# Patient Record
Sex: Female | Born: 1970
Health system: Southern US, Community
[De-identification: ages and names within clinical notes are randomized; demographics above are authoritative.]

## PROBLEM LIST (undated history)

## (undated) DIAGNOSIS — R51 Headache: Secondary | ICD-10-CM

## (undated) DIAGNOSIS — M549 Dorsalgia, unspecified: Secondary | ICD-10-CM

## (undated) DIAGNOSIS — E785 Hyperlipidemia, unspecified: Secondary | ICD-10-CM

## (undated) DIAGNOSIS — R351 Nocturia: Secondary | ICD-10-CM

## (undated) DIAGNOSIS — I671 Cerebral aneurysm, nonruptured: Secondary | ICD-10-CM

## (undated) DIAGNOSIS — I10 Essential (primary) hypertension: Secondary | ICD-10-CM

## (undated) HISTORY — PX: TONSILLECTOMY: SUR1361

## (undated) HISTORY — PX: TUBAL LIGATION: SHX77

---

## 2007-10-06 ENCOUNTER — Emergency Department (HOSPITAL_COMMUNITY): Admission: EM | Admit: 2007-10-06 | Discharge: 2007-10-06 | Payer: Self-pay | Admitting: Emergency Medicine

## 2008-03-30 ENCOUNTER — Other Ambulatory Visit: Admission: RE | Admit: 2008-03-30 | Discharge: 2008-03-30 | Payer: Self-pay | Admitting: Obstetrics and Gynecology

## 2008-04-08 ENCOUNTER — Inpatient Hospital Stay (HOSPITAL_COMMUNITY): Admission: AD | Admit: 2008-04-08 | Discharge: 2008-04-10 | Payer: Self-pay | Admitting: Obstetrics & Gynecology

## 2008-04-08 ENCOUNTER — Ambulatory Visit: Payer: Self-pay | Admitting: Family Medicine

## 2008-05-05 ENCOUNTER — Ambulatory Visit: Payer: Self-pay | Admitting: Family

## 2008-05-05 ENCOUNTER — Inpatient Hospital Stay (HOSPITAL_COMMUNITY): Admission: AD | Admit: 2008-05-05 | Discharge: 2008-05-09 | Payer: Self-pay | Admitting: Obstetrics & Gynecology

## 2009-12-03 ENCOUNTER — Emergency Department (HOSPITAL_COMMUNITY): Admission: EM | Admit: 2009-12-03 | Discharge: 2009-12-03 | Payer: Self-pay | Admitting: Emergency Medicine

## 2010-02-13 ENCOUNTER — Emergency Department (HOSPITAL_COMMUNITY)
Admission: EM | Admit: 2010-02-13 | Discharge: 2010-02-13 | Payer: Self-pay | Source: Home / Self Care | Admitting: Emergency Medicine

## 2010-03-05 ENCOUNTER — Emergency Department (HOSPITAL_COMMUNITY)
Admission: EM | Admit: 2010-03-05 | Discharge: 2010-03-05 | Payer: Self-pay | Source: Home / Self Care | Admitting: Emergency Medicine

## 2010-03-05 ENCOUNTER — Emergency Department (HOSPITAL_COMMUNITY): Admission: EM | Admit: 2010-03-05 | Payer: Self-pay | Source: Home / Self Care | Admitting: Emergency Medicine

## 2010-05-07 ENCOUNTER — Other Ambulatory Visit: Payer: Self-pay | Admitting: Obstetrics & Gynecology

## 2010-05-07 ENCOUNTER — Other Ambulatory Visit (HOSPITAL_COMMUNITY)
Admission: RE | Admit: 2010-05-07 | Discharge: 2010-05-07 | Disposition: A | Discharge status: Home / Self Care | Payer: Medicaid Other | Source: Ambulatory Visit | Attending: Obstetrics & Gynecology | Admitting: Obstetrics & Gynecology

## 2010-05-07 DIAGNOSIS — Z01419 Encounter for gynecological examination (general) (routine) without abnormal findings: Secondary | ICD-10-CM | POA: Insufficient documentation

## 2010-05-07 DIAGNOSIS — Z113 Encounter for screening for infections with a predominantly sexual mode of transmission: Secondary | ICD-10-CM | POA: Insufficient documentation

## 2010-06-16 ENCOUNTER — Emergency Department (HOSPITAL_COMMUNITY)
Admission: EM | Admit: 2010-06-16 | Discharge: 2010-06-16 | Disposition: A | Discharge status: Nursing Home (Includes ICF) | Payer: Medicaid Other | Attending: Emergency Medicine | Admitting: Emergency Medicine

## 2010-06-16 ENCOUNTER — Emergency Department (HOSPITAL_COMMUNITY): Payer: Medicaid Other

## 2010-06-16 DIAGNOSIS — I10 Essential (primary) hypertension: Secondary | ICD-10-CM | POA: Insufficient documentation

## 2010-06-16 DIAGNOSIS — I609 Nontraumatic subarachnoid hemorrhage, unspecified: Secondary | ICD-10-CM | POA: Insufficient documentation

## 2010-06-16 DIAGNOSIS — R51 Headache: Secondary | ICD-10-CM | POA: Insufficient documentation

## 2010-06-16 DIAGNOSIS — O99891 Other specified diseases and conditions complicating pregnancy: Secondary | ICD-10-CM | POA: Insufficient documentation

## 2010-06-16 DIAGNOSIS — R42 Dizziness and giddiness: Secondary | ICD-10-CM | POA: Insufficient documentation

## 2010-06-16 DIAGNOSIS — J329 Chronic sinusitis, unspecified: Secondary | ICD-10-CM | POA: Insufficient documentation

## 2010-06-16 HISTORY — PX: INTRACRANIAL ANEURYSM REPAIR: SHX1839

## 2010-06-16 LAB — DIFFERENTIAL
Eosinophils Absolute: 0 10*3/uL (ref 0.0–0.7)
Eosinophils Relative: 0 % (ref 0–5)
Monocytes Absolute: 0.3 10*3/uL (ref 0.1–1.0)
Neutro Abs: 12.9 10*3/uL — ABNORMAL HIGH (ref 1.7–7.7)

## 2010-06-16 LAB — CBC
Hemoglobin: 13.5 g/dL (ref 12.0–15.0)
MCH: 28.4 pg (ref 26.0–34.0)
MCV: 84.5 fL (ref 78.0–100.0)
RBC: 4.76 MIL/uL (ref 3.87–5.11)
WBC: 14.1 10*3/uL — ABNORMAL HIGH (ref 4.0–10.5)

## 2010-06-16 LAB — GLUCOSE, CAPILLARY

## 2010-06-16 LAB — BASIC METABOLIC PANEL
BUN: 5 mg/dL — ABNORMAL LOW (ref 6–23)
Chloride: 101 mEq/L (ref 96–112)
Creatinine, Ser: 0.49 mg/dL (ref 0.4–1.2)
Glucose, Bld: 119 mg/dL — ABNORMAL HIGH (ref 70–99)

## 2010-06-25 LAB — COMPREHENSIVE METABOLIC PANEL
ALT: 12 U/L (ref 0–35)
Alkaline Phosphatase: 115 U/L (ref 39–117)
CO2: 19 mEq/L (ref 19–32)
Calcium: 8.9 mg/dL (ref 8.4–10.5)
GFR calc Af Amer: >60 mL/min (ref >60)
Glucose, Bld: 84 mg/dL (ref 70–99)
Sodium: 138 mEq/L (ref 135–145)
Total Bilirubin: 0.5 mg/dL (ref 0.3–1.2)

## 2010-06-25 LAB — CBC
HCT: 35.6 % — ABNORMAL LOW (ref 36.0–46.0)
Hemoglobin: 11.9 g/dL — ABNORMAL LOW (ref 12.0–15.0)
Hemoglobin: 13 g/dL (ref 12.0–15.0)
MCHC: 33.4 g/dL (ref 30.0–36.0)
MCV: 88.8 fL (ref 78.0–100.0)
Platelets: 102 10*3/uL — ABNORMAL LOW (ref 150–400)
Platelets: 116 10*3/uL — ABNORMAL LOW (ref 150–400)
WBC: 9.2 10*3/uL (ref 4.0–10.5)

## 2010-06-25 LAB — CREATININE CLEARANCE, URINE, 24 HOUR
Creatinine Clearance: 139 mL/min — ABNORMAL HIGH (ref 75–115)
Creatinine, 24H Ur: 1039 mg/d (ref 700–1800)
Creatinine: 0.52 mg/dL (ref 0.40–1.20)
Urine Total Volume-CRCL: 550 mL

## 2010-06-25 LAB — BASIC METABOLIC PANEL
Calcium: 8.8 mg/dL (ref 8.4–10.5)
Chloride: 109 mEq/L (ref 96–112)
Creatinine, Ser: 0.52 mg/dL (ref 0.4–1.2)
GFR calc Af Amer: >60 mL/min (ref >60)
Glucose, Bld: 86 mg/dL (ref 70–99)
Potassium: 3.8 mEq/L (ref 3.5–5.1)

## 2010-06-25 LAB — PROTEIN, URINE, 24 HOUR: Protein, Urine: 9 mg/dL

## 2010-06-26 LAB — LACTATE DEHYDROGENASE: LDH: 155 U/L (ref 94–250)

## 2010-06-26 LAB — CBC
HCT: 32.6 % — ABNORMAL LOW (ref 36.0–46.0)
Hemoglobin: 12.6 g/dL (ref 12.0–15.0)
Hemoglobin: 13.6 g/dL (ref 12.0–15.0)
MCHC: 33.7 g/dL (ref 30.0–36.0)
MCHC: 33.8 g/dL (ref 30.0–36.0)
MCV: 89.2 fL (ref 78.0–100.0)
MCV: 89.3 fL (ref 78.0–100.0)
Platelets: 120 10*3/uL — ABNORMAL LOW (ref 150–400)
RBC: 3.65 MIL/uL — ABNORMAL LOW (ref 3.87–5.11)
RBC: 4.18 MIL/uL (ref 3.87–5.11)
WBC: 7.8 10*3/uL (ref 4.0–10.5)

## 2010-06-26 LAB — COMPREHENSIVE METABOLIC PANEL
ALT: 11 U/L (ref 0–35)
CO2: 21 mEq/L (ref 19–32)
Calcium: 9.3 mg/dL (ref 8.4–10.5)
Chloride: 108 mEq/L (ref 96–112)
Creatinine, Ser: 0.66 mg/dL (ref 0.4–1.2)
GFR calc Af Amer: >60 mL/min (ref >60)
GFR calc non Af Amer: >60 mL/min (ref >60)
Sodium: 138 mEq/L (ref 135–145)

## 2010-06-26 LAB — URIC ACID: Uric Acid, Serum: 7.6 mg/dL — ABNORMAL HIGH (ref 2.4–7.0)

## 2010-06-26 LAB — RPR: RPR Ser Ql: NONREACTIVE

## 2010-07-24 NOTE — Discharge Summary (Signed)
[name], [name]              ACCOUNT NO.:  [id number]   MEDICAL RECORD NO.:  [id number]          PATIENT TYPE:  INP   LOCATION:  9155                          FACILITY:  [facility]   PHYSICIAN:  Tanya S. Shawnie Pons, M.D.   DATE OF BIRTH:  1970/10/17   DATE OF ADMISSION:  04/08/2008  DATE OF DISCHARGE:  04/10/2008                               DISCHARGE SUMMARY   FINAL DIAGNOSES:  1. Intrauterine pregnancy at 37-3/7th weeks.  2. Breech presentation.  3. Chronic hypertension versus preeclampsia.  Preeclampsia ruled out      by 24-hour urine protein.  4. History of tobacco use.  5. Asthma.   PERTINENT LABS:  A 24-hour urine as 50 mg of protein.  Low platelet  count that was stable in the 115 range, creatinine of 0.5.  LFTs were  within normal limits.  Hemoglobin of 13.  Blood pressure during this  hospitalization were between the 110s-140s/60s-80s.   REASON FOR ADMISSION:  Briefly, please see H and P on the chart.  The  patient is a 40 year old G4, P3, at 71 and 3/7, on admission had  elevated blood pressures and was seen at the [facility].  She was found  to have low platelet count of 114 there and was sent in for 24-hour  urine and ultrasound.  The patient was admitted to the floor, where 24-  hour urine was started.  She had serial blood pressure monitoring and  labs, which showed platelet count was stable at 116.  She underwent  ultrasonography, which showed a baby at 51st percentile, 5 pounds 6  ounces, AFI of 11.87 in footling breech presentation.  She denies  significant headache or right upper quadrant pain.  Given the patient's  preterm condition and negative 24-hour urine, I felt the patient was  stable and lack of symptoms, so it was felt the patient was stable for  discharge.   DISCHARGE DISPOSITION AND CONDITION:  The patient was discharged home in  improved condition.  She will be discharged home on bedrest.  She will  follow up with the [facility] on April 11, 2008.   The goal is to get  her to at least 37 plus weeks. At that time, she may undergo external  cephalic version, if the baby remains in the breech presentation.  This  is usually done between 36th and 37th weeks.  The patient is also given  PIH precautions.  Continue Aldomet as previously scheduled.      Shelbie Proctor. Shawnie Pons, M.D.  Electronically Signed     TSP/MEDQ  D:  04/10/2008  T:  04/10/2008  Job:  [id number]

## 2010-07-24 NOTE — Op Note (Signed)
NAMEBILLIJO, DILLING              ACCOUNT NO.:  [id number]   MEDICAL RECORD NO.:  [id number]          PATIENT TYPE:  INP   LOCATION:  9133                          [facility]:  [facility]   PHYSICIAN:  Allie Bossier, MD        DATE OF BIRTH:  05-25-1970   DATE OF PROCEDURE:  05/08/2008  DATE OF DISCHARGE:  05/09/2008                               OPERATIVE REPORT   PREOPERATIVE DIAGNOSIS:  Multiparity, desires sterility.   POSTOPERATIVE DIAGNOSIS:  Multiparity, desires sterility.   PROCEDURE:  Application of Filshie clips, bilateral tubal sterilization  procedure.   SURGEON:  Myra C. Marice Potter, MD   ANESTHESIA:  Epidural, Cristela Blue, MD   COMPLICATIONS:  None.   ESTIMATED BLOOD LOSS:  Minimal.   SPECIMENS:  None.   FINDINGS:  Normal oviducts.   DETAILS OF PROCEDURE AND FINDINGS:  Risks, benefits, alternatives, and  failure rate (1/200) were explained, understood, and accepted.  Consents  were signed.  She declines alternative forms of birth control.  She was  taken to the operating room and her epidural was bolused for surgery.  Her abdomen was prepped and draped in usual sterile fashion.  Adequate  anesthesia was assured and her umbilicus was inspected.  To provide an  excellent cosmetic result, I opted to go in a transverse fashion through  the umbilicus, elevated the fascia with Kocher clamps, and incised it  with curved Mayo scissors.  The fascial incision was extended  bilaterally.  She was placed in Trendelenburg position and the omentum  was packed with a Ray-Tec.  Army-Navy retractors were used to visualize  the oviduct on her right.  It was grasped with Babcock clamp and traced  to its fimbriated end.  It was then retraced to the isthmic region where  a Hulka clip was placed across the entire oviduct in a perpendicular  fashion.  No bleeding was noted.  I repeated the same procedure on the  left side.  Again, the fimbriae were identified and the Filshie clip was  placed  perpendicular to the tube in the isthmic region.  The Ray-Tec was  removed.  She was taken out of Trendelenburg position and the fascia was  elevated with Kocher clamps.  Fascia was then closed with a 0 Vicryl  running nonlocking suture.  The subcutaneous tissue and her umbilicus  was infiltrated with 10 mL of 0.5% Marcaine.  A subcuticular closure was  done with 4-0 Vicryl suture and Dermabond was placed on the incision.  She was taken to recovery room in stable condition, Instrument, sponge,  and needle counts were correct.      Allie Bossier, MD  Electronically Signed     MCD/MEDQ  D:  05/10/2008  T:  05/11/2008  Job:  [id number]

## 2010-07-27 NOTE — Discharge Summary (Signed)
[name], [name]              ACCOUNT NO.:  [id number]   MEDICAL RECORD NO.:  [id number]          PATIENT TYPE:  INP   LOCATION:  9133                          [facility]ILITY[facility]  [facility]   PHYSICIAN:  Scheryl Darter, MD       DATE OF BIRTH:  [birth year]   DATE OF ADMISSION:  05/05/2008  DATE OF DISCHARGE:  05/09/2008                               DISCHARGE SUMMARY   DISCHARGE DIAGNOSES:  1. Chronic hypertension.  2. Term pregnancy delivered by spontaneous vaginal delivery.  3. Status post bilateral tubal sterilization with Filshie clips.  4. Advanced maternal age.   DISCHARGE MEDICATIONS:  1. Percocet 5/325 mg p.o. q.4 h. p.r.n. pain.  2. Tenoretic 50/25 1 tablet p.o. daily.  3. Colace 100 mg p.o. b.i.d. p.r.n. constipation.  4. Ibuprofen 600 mg p.o. q.4 h. p.r.n. pain.  5. Prenatal vitamin daily.   DISCONTINUED MEDICATIONS:  Aldomet.   CONSULTANTS:  None.   BRIEF HOSPITAL COURSE:  This is a 40 year old G4, P3-0-3 who presented  at 39 weeks 0 days for induction of labor with chronic hypertension with  severely elevated blood pressures.  On day 1 of admission, the patient  denied signs and symptoms of preeclampsia.  The patient had a CMET which  was within normal limits, platelets of 120, hemoglobin of 13.6,  hematocrit of 40.2, uric acid 7.6, LDH of 155 on admission.  The patient  was started on Pitocin for induction, also had Foley bulb placed.  The  patient was started on Aldomet for chronic hypertension with labetalol  p.r.n.  The patient's admission blood pressure was 158/88.  On day 2 of  admission, the patient's blood pressure increased to 173/105; however,  the patient continued to deny symptoms of preeclampsia.  The patient was  continued on Pitocin started on labetalol IV as well as magnesium given  her severely elevated blood pressures.  The patient's blood pressures  improved with IV labetalol on day 2 of hospital admission and the  patient has had artificial rupture of  membranes and Cytotec placed  intravaginally as a part of induction process.  Fetal heart tracing  remained overall reassuring during labor course.  The patient delivered  a viable female infant on May 07, 2008, with Apgars of 9 and 9 by  NSVD under epidural anesthesia.  Nuchal cord was reduced x2, cord was  clamped and cut, and infant to mother then RN.  Placenta delivered  spontaneously via Tomasa Blase, no lacerations requiring repair were noted,  EBL was 700 mL.  The patient and infant were stable.  On day of  discharge, the patient had blood pressures of 155-158 over 92-95.  The  patient was started on Tenoretic 50/25 mg p.o. daily for chronic  hypertension.  The patient also had received bilateral tubal  sterilization with Filshie clips on May 08, 2008.  Please refer to  operative notes for procedure details.  The patient was discharged to  home on May 09, 2008, in stable condition.  Infant was discharged  to home with mother.  The patient will follow up in 6 weeks at [facility].  The patient is bottle feeding with bilateral tubal sterilization  for contraception.  The patient was afebrile with pain well controlled,  ambulating with no complaints on day of discharge.      Bobby Rumpf, MD      Scheryl Darter, MD  Electronically Signed    KC/MEDQ  D:  06/08/2008  T:  06/08/2008  Job:  [id number]

## 2010-09-14 ENCOUNTER — Other Ambulatory Visit: Payer: Self-pay | Admitting: Obstetrics & Gynecology

## 2010-09-21 ENCOUNTER — Encounter (HOSPITAL_COMMUNITY)
Admission: RE | Admit: 2010-09-21 | Discharge: 2010-09-21 | Disposition: A | Discharge status: Home / Self Care | Payer: Medicaid Other | Source: Ambulatory Visit | Attending: Obstetrics & Gynecology | Admitting: Obstetrics & Gynecology

## 2010-09-21 ENCOUNTER — Encounter (HOSPITAL_COMMUNITY): Payer: Self-pay

## 2010-09-21 ENCOUNTER — Other Ambulatory Visit: Payer: Self-pay

## 2010-09-21 HISTORY — DX: Essential (primary) hypertension: I10

## 2010-09-21 HISTORY — DX: Headache: R51

## 2010-09-21 LAB — CBC
HCT: 37.7 % (ref 36.0–46.0)
MCH: 26.1 pg (ref 26.0–34.0)
MCHC: 32.4 g/dL (ref 30.0–36.0)
MCV: 80.6 fL (ref 78.0–100.0)
RDW: 14.7 % (ref 11.5–15.5)
WBC: 7.2 10*3/uL (ref 4.0–10.5)

## 2010-09-21 LAB — COMPREHENSIVE METABOLIC PANEL
Albumin: 3.6 g/dL (ref 3.5–5.2)
BUN: 7 mg/dL (ref 6–23)
Calcium: 10 mg/dL (ref 8.4–10.5)
Chloride: 104 mEq/L (ref 96–112)
Creatinine, Ser: 0.7 mg/dL (ref 0.50–1.10)
GFR calc non Af Amer: >60 mL/min (ref >60)
Total Bilirubin: 0.1 mg/dL — ABNORMAL LOW (ref 0.3–1.2)

## 2010-09-21 LAB — URINALYSIS, ROUTINE W REFLEX MICROSCOPIC
Ketones, ur: 15 mg/dL — AB
Leukocytes, UA: NEGATIVE
Nitrite: NEGATIVE
Protein, ur: NEGATIVE mg/dL
pH: 6 (ref 5.0–8.0)

## 2010-09-21 LAB — HCG, QUANTITATIVE, PREGNANCY: hCG, Beta Chain, Quant, S: 1 m[IU]/mL (ref <5)

## 2010-09-21 NOTE — Patient Instructions (Signed)
20 [name]  09/21/2010   Your procedure is scheduled on:  09/26/10  Report to [facility] at 10:00 AM.  Call this number if you have problems the morning of surgery: [phone]   Remember:   Do not eat food:After Midnight.  Do not drink clear liquids: After Midnight.  Take these medicines the morning of surgery with A SIP OF WATER: Bring Albuterol inhaler and take aldomet.   Do not wear jewelry, make-up or nail polish.  Do not bring valuables to the hospital.  Contacts, dentures or bridgework may not be worn into surgery.  Leave suitcase in the car. After surgery it may be brought to your room.  For patients admitted to the hospital, checkout time is 11:00 AM the day of discharge.   Patients discharged the day of surgery will not be allowed to drive home.  Name and phone number of your driver: mom  Special Instructions: CHG Shower Shower 2 days before surgery and 1 day before surgery with Hibiclens.   Please read over the following fact sheets that you were given: Pain Booklet, MRSA Information, Surgical Site Infection Prevention and Anesthesia Post-op Instructions    General Instructions for Surgery These instructions are generic. They cover multiple surgeries and are a general pre-surgical guideline. They do not apply to all procedures. You may have questions. Answers will be provided by your caregiver. PREPARING FOR SURGERY  Stop smoking at least two weeks prior to surgery. This lowers risk during surgery. Ask your caregiver for help with this if needed. The benefits are well worth it. This is a good time for a healthy lifestyle change.   Your caregiver may advise that you stop taking certain medications that may affect the outcome of the surgery and your ability to heal. For example, you may need to stop taking anti-inflammatories, such as aspirin or ibuprofen because of possible bleeding problems. Other medications may have interactions with anesthesia.   BE SURE TO LET  YOUR CAREGIVER KNOW IF YOU HAVE BEEN ON STEROIDS FOR LONG PERIODS OF TIME. THIS IS CRITICAL.   Your caregiver will discuss possible risks and complications with you before surgery. In addition to the usual risks of anesthesia, other common risks and complications include blood loss and replacement (not applicable to minor surgical procedures), temporary increase in pain due to surgery, uncorrected pain or problems the surgery was meant to correct, infection, or new damage.  BEFORE SURGERY Check in at the admissions desk to fill out necessary forms if you are not pre-registered. There will be consent forms to sign prior to the procedure. There is a waiting area for your family while you are having your procedure.  LET YOUR CAREGIVERS KNOW ABOUT THE FOLLOWING:  Allergies.  Medications taken including herbs, eye drops, over the counter medications, and creams.   Use of steroids (by mouth or creams).   Previous problems with anesthetics or novocaine.   Possibility of pregnancy, if this applies.  History of blood clots (thrombophlebitis).   History of bleeding or blood problems.   Previous surgery.   Other health problems.   FOLLOWING SURGERY You will be taken to the recovery area where a nurse will watch and check your progress. Once you are awake, stable, and taking fluids well, barring other problems you will be allowed to go home. Once home, an ice pack wrapped in a light towel applied to your operative site may help with discomfort and keep the swelling down. Follow instructions as suggested by your  caregiver. HOME CARE INSTRUCTIONS  Follow your caregiver's instructions as to activities, exercises, physical therapy, or driving a car.   Weight reduction may be helpful depending upon the type of surgery.   Daily exercise is helpful. Maintain strength and range of motion as instructed.   Only take over-the-counter or prescription medicines for pain, discomfort, or fever as directed by  your caregiver.  SEEK MEDICAL CARE IF:  You notice increased bleeding (more than a small spot) from the wound.   You soak more than four pads following a uterine procedure unless instructed otherwise.   There is redness, swelling, or increasing pain in the wound.   You notice pus coming from wound.   An unexplained oral temperature over 101 develops, or as your caregiver suggests.   You notice a foul smell coming from the wound or dressing.   You become light headed or if you pass out (faint).  SEEK IMMEDIATE MEDICAL CARE IF:  You develop a rash.   You have difficulty breathing.   You develop any allergic problems.  Document Released: 06/03/2000 Document Re-Released: 05/24/2008 [facility] Patient Information 2011 [place], [place].

## 2010-09-25 NOTE — H&P (Signed)
Preoperative History and Physical  [name] is a 40 y.o. status post BTL in past, who had a failure, conceived and delivered.  She wants a definitive surgical sterilization.  She is admitted for bilateral salpingectomy.    Of note, the patient had a subarachnoid hemorrhage at [redacted] weeks gestation which prompted delivery and subsequent surgical repair.  She is without neurological sequale.  PMH:    Past Medical History  Diagnosis Date  . Hypertension   . Asthma   . Headache     PSH:     Past Surgical History  Procedure Date  . Intracranial aneurysm repair 06-16-2010  . Cesarean section 2012      SH:   History  Substance Use Topics  . Smoking status: Current Everyday Smoker -- 1.0 packs/day for 10 years    Types: Cigarettes  . Smokeless tobacco: Not on file  . Alcohol Use: No    FH:    Family History  Problem Relation Age of Onset  . Anesthesia problems Neg Hx   . Hypotension Neg Hx   . Malignant hyperthermia Neg Hx   . Pseudochol deficiency Neg Hx      Allergies: No Known Allergies  Medications:      No current facility-administered medications for this encounter. Current outpatient prescriptions:acetaminophen (TYLENOL) 500 MG tablet, Take 500 mg by mouth daily.  , Disp: , Rfl: ;  albuterol (PROVENTIL,VENTOLIN) 90 MCG/ACT inhaler, Inhale 2 puffs into the lungs daily as needed. For shortness of breath , Disp: , Rfl: ;  ibuprofen (ADVIL,MOTRIN) 200 MG tablet, Take 200 mg by mouth daily.  , Disp: , Rfl: ;  methyldopa (ALDOMET) 500 MG tablet, Take 500 mg by mouth 2 (two) times daily.  , Disp: , Rfl:   Review of Systems: - History obtained from the patient General ROS: negative Respiratory ROS: no cough, shortness of breath, or wheezing Cardiovascular ROS: no chest pain or dyspnea on exertion Gastrointestinal ROS: no abdominal pain, change in bowel habits, or black or bloody stools Genito-Urinary ROS: no dysuria, trouble voiding, or hematuria Neurological ROS: no  TIA or stroke symptoms negative  PHYSICAL EXAM:  There were no vitals taken for this visit.  General: General appearance - alert, well appearing, and in no distress Mental status - alert, oriented to person, place, and time Neck - supple, no significant adenopathy Chest - clear to auscultation, no wheezes, rales or rhonchi, symmetric air entry Heart - normal rate, regular rhythm, normal S1, S2, no murmurs, rubs, clicks or gallops Abdomen - soft, nontender, nondistended, no masses or organomegaly Breasts - breasts appear normal, no suspicious masses, no skin or nipple changes or axillary nodes Pelvic - normal external genitalia, vulva, vagina, cervix, uterus and adnexa Rectal - normal rectal, no masses Neurological - alert, oriented, normal speech, no focal findings or movement disorder noted Extremities - peripheral pulses normal, no pedal edema, no clubbing or cyanosis   Labs: Recent Results (from the past 336 hour(s))  URINALYSIS, ROUTINE W REFLEX MICROSCOPIC   Collection Time   09/21/10  2:01 PM      Component Value Range   Color, Urine YELLOW  YELLOW    Appearance CLEAR  CLEAR    Specific Gravity, Urine 1.010  1.005 - 1.030    pH 6.0  5.0 - 8.0    Glucose, UA NEGATIVE  NEGATIVE (mg/dL)   Hgb urine dipstick TRACE (*) NEGATIVE    Bilirubin Urine SMALL (*) NEGATIVE    Ketones, ur 15 (*)  NEGATIVE (mg/dL)   Protein, ur NEGATIVE  NEGATIVE (mg/dL)   Urobilinogen, UA 0.2  0.0 - 1.0 (mg/dL)   Nitrite NEGATIVE  NEGATIVE    Leukocytes, UA NEGATIVE  NEGATIVE   URINE MICROSCOPIC-ADD ON   Collection Time   09/21/10  2:01 PM      Component Value Range   Squamous Epithelial / LPF FEW (*) RARE    WBC, UA 0-2  <3 (WBC/hpf)   RBC / HPF 0-2  <3 (RBC/hpf)   Bacteria, UA RARE  RARE   SURGICAL PCR SCREEN   Collection Time   09/21/10  2:10 PM      Component Value Range   MRSA, PCR NEGATIVE  NEGATIVE    Staphylococcus aureus POSITIVE (*) NEGATIVE   CBC   Collection Time   09/21/10  2:13  PM      Component Value Range   WBC 7.2  4.0 - 10.5 (K/uL)   RBC 4.68  3.87 - 5.11 (MIL/uL)   Hemoglobin 12.2  12.0 - 15.0 (g/dL)   HCT 16.1  09.6 - 04.5 (%)   MCV 80.6  78.0 - 100.0 (fL)   MCH 26.1  26.0 - 34.0 (pg)   MCHC 32.4  30.0 - 36.0 (g/dL)   RDW 40.9  81.1 - 91.4 (%)   Platelets 238  150 - 400 (K/uL)  COMPREHENSIVE METABOLIC PANEL   Collection Time   09/21/10  2:13 PM      Component Value Range   Sodium 140  135 - 145 (mEq/L)   Potassium 4.1  3.5 - 5.1 (mEq/L)   Chloride 104  96 - 112 (mEq/L)   CO2 28  19 - 32 (mEq/L)   Glucose, Bld 99  70 - 99 (mg/dL)   BUN 7  6 - 23 (mg/dL)   Creatinine, Ser 7.82  0.50 - 1.10 (mg/dL)   Calcium 95.6  8.4 - 10.5 (mg/dL)   Total Protein 7.2  6.0 - 8.3 (g/dL)   Albumin 3.6  3.5 - 5.2 (g/dL)   AST 15  0 - 37 (U/L)   ALT 26  0 - 35 (U/L)   Alkaline Phosphatase 69  39 - 117 (U/L)   Total Bilirubin 0.1 (*) 0.3 - 1.2 (mg/dL)   GFR calc non Af Amer >60  >60 (mL/min)   GFR calc Af Amer >60  >60 (mL/min)  HCG, QUANTITATIVE, PREGNANCY   Collection Time   09/21/10  2:13 PM      Component Value Range   hCG, Beta Chain, Quant, S 1  <5 (mIU/mL)    EKG: No orders found for this or any previous visit.  Imaging Studies: No results found.    Assessment: 1.. Multiparous female desires permanent sterilization 2.  Status post tubal failure in past  Plan: Laparoscopic bilateral salpingectomy.    Lazaro Arms 09/25/2010 3:16 PM

## 2010-09-26 ENCOUNTER — Encounter (HOSPITAL_COMMUNITY)
Admission: RE | Disposition: A | Discharge status: Home / Self Care | Payer: Self-pay | Source: Ambulatory Visit | Attending: Obstetrics & Gynecology

## 2010-09-26 ENCOUNTER — Encounter (HOSPITAL_COMMUNITY): Payer: Self-pay | Admitting: Anesthesiology

## 2010-09-26 ENCOUNTER — Encounter (HOSPITAL_COMMUNITY): Payer: Self-pay | Admitting: *Deleted

## 2010-09-26 ENCOUNTER — Ambulatory Visit (HOSPITAL_COMMUNITY)
Admission: RE | Admit: 2010-09-26 | Discharge: 2010-09-26 | Disposition: A | Discharge status: Home / Self Care | Payer: Medicaid Other | Source: Ambulatory Visit | Attending: Obstetrics & Gynecology | Admitting: Obstetrics & Gynecology

## 2010-09-26 ENCOUNTER — Ambulatory Visit (HOSPITAL_COMMUNITY): Payer: Medicaid Other | Admitting: Anesthesiology

## 2010-09-26 ENCOUNTER — Other Ambulatory Visit: Payer: Self-pay | Admitting: Obstetrics & Gynecology

## 2010-09-26 DIAGNOSIS — Z9889 Other specified postprocedural states: Secondary | ICD-10-CM

## 2010-09-26 DIAGNOSIS — Z01812 Encounter for preprocedural laboratory examination: Secondary | ICD-10-CM | POA: Insufficient documentation

## 2010-09-26 DIAGNOSIS — Z302 Encounter for sterilization: Secondary | ICD-10-CM | POA: Insufficient documentation

## 2010-09-26 DIAGNOSIS — I10 Essential (primary) hypertension: Secondary | ICD-10-CM | POA: Insufficient documentation

## 2010-09-26 DIAGNOSIS — Z79899 Other long term (current) drug therapy: Secondary | ICD-10-CM | POA: Insufficient documentation

## 2010-09-26 HISTORY — PX: LAPAROSCOPY: SHX197

## 2010-09-26 SURGERY — LAPAROSCOPY OPERATIVE
Anesthesia: General | Site: Abdomen | Wound class: Clean

## 2010-09-26 MED ORDER — FENTANYL CITRATE 0.05 MG/ML IJ SOLN
25.0000 ug | INTRAMUSCULAR | Status: DC | PRN
  Administered 2010-09-26: 50 ug via INTRAVENOUS

## 2010-09-26 MED ORDER — PROMETHAZINE HCL 12.5 MG PO TABS: 25.0000 mg | ORAL_TABLET | Freq: Four times a day (QID) | ORAL | Status: DC | PRN

## 2010-09-26 MED ORDER — FENTANYL CITRATE 0.05 MG/ML IJ SOLN
INTRAMUSCULAR | Status: AC
  Administered 2010-09-26: 50 ug via INTRAVENOUS
  Filled 2010-09-26: qty 5

## 2010-09-26 MED ORDER — MIDAZOLAM HCL 2 MG/2ML IJ SOLN
INTRAMUSCULAR | Status: AC
  Filled 2010-09-26: qty 2

## 2010-09-26 MED ORDER — KETOROLAC TROMETHAMINE 30 MG/ML IJ SOLN
INTRAMUSCULAR | Status: AC
  Filled 2010-09-26: qty 1

## 2010-09-26 MED ORDER — GLYCOPYRROLATE 0.2 MG/ML IJ SOLN
INTRAMUSCULAR | Status: AC
  Filled 2010-09-26: qty 1

## 2010-09-26 MED ORDER — CEFAZOLIN SODIUM 1-5 GM-% IV SOLN
INTRAVENOUS | Status: DC | PRN
  Administered 2010-09-26: 1 g via INTRAVENOUS

## 2010-09-26 MED ORDER — HYDROCODONE-ACETAMINOPHEN 5-500 MG PO TABS: 1.0000 | ORAL_TABLET | Freq: Four times a day (QID) | ORAL | Status: AC | PRN

## 2010-09-26 MED ORDER — SODIUM CHLORIDE 0.9 % IR SOLN
Status: DC | PRN
  Administered 2010-09-26: 3000 mL

## 2010-09-26 MED ORDER — FENTANYL CITRATE 0.05 MG/ML IJ SOLN
INTRAMUSCULAR | Status: DC | PRN
  Administered 2010-09-26 (×3): 50 ug via INTRAVENOUS

## 2010-09-26 MED ORDER — PROPOFOL 10 MG/ML IV EMUL
INTRAVENOUS | Status: DC | PRN
  Administered 2010-09-26: 150 mg via INTRAVENOUS

## 2010-09-26 MED ORDER — CEFAZOLIN SODIUM 1-5 GM-% IV SOLN: 1.0000 g | Freq: Once | INTRAVENOUS | Status: DC

## 2010-09-26 MED ORDER — LACTATED RINGERS IV SOLN
INTRAVENOUS | Status: DC
  Administered 2010-09-26 (×2): via INTRAVENOUS

## 2010-09-26 MED ORDER — NEOSTIGMINE METHYLSULFATE 1 MG/ML IJ SOLN
INTRAMUSCULAR | Status: AC
  Filled 2010-09-26: qty 10

## 2010-09-26 MED ORDER — ONDANSETRON HCL 4 MG/2ML IJ SOLN
INTRAMUSCULAR | Status: AC
  Filled 2010-09-26: qty 2

## 2010-09-26 MED ORDER — SODIUM CHLORIDE 0.9 % IR SOLN
Status: DC | PRN
  Administered 2010-09-26: 1000 mL

## 2010-09-26 MED ORDER — ONDANSETRON HCL 4 MG/2ML IJ SOLN
4.0000 mg | Freq: Once | INTRAMUSCULAR | Status: AC
  Administered 2010-09-26: 4 mg via INTRAVENOUS

## 2010-09-26 MED ORDER — MIDAZOLAM HCL 2 MG/2ML IJ SOLN
1.0000 mg | INTRAMUSCULAR | Status: DC | PRN
  Administered 2010-09-26 (×2): 2 mg via INTRAVENOUS

## 2010-09-26 MED ORDER — ONDANSETRON HCL 4 MG/2ML IJ SOLN: 4.0000 mg | Freq: Once | INTRAMUSCULAR | Status: DC | PRN

## 2010-09-26 MED ORDER — ROCURONIUM BROMIDE 50 MG/5ML IV SOLN
INTRAVENOUS | Status: AC
  Filled 2010-09-26: qty 1

## 2010-09-26 MED ORDER — ROCURONIUM BROMIDE 100 MG/10ML IV SOLN
INTRAVENOUS | Status: DC | PRN
  Administered 2010-09-26: 5 mg via INTRAVENOUS
  Administered 2010-09-26: 30 mg via INTRAVENOUS

## 2010-09-26 MED ORDER — KETOROLAC TROMETHAMINE 30 MG/ML IJ SOLN
30.0000 mg | Freq: Once | INTRAMUSCULAR | Status: AC
  Administered 2010-09-26: 30 mg via INTRAVENOUS

## 2010-09-26 MED ORDER — GLYCOPYRROLATE 0.2 MG/ML IJ SOLN
INTRAMUSCULAR | Status: DC | PRN
  Administered 2010-09-26: .2 mg via INTRAVENOUS
  Administered 2010-09-26: 0.2 mg via INTRAVENOUS

## 2010-09-26 MED ORDER — CEFAZOLIN SODIUM 1-5 GM-% IV SOLN
INTRAVENOUS | Status: AC
  Filled 2010-09-26: qty 50

## 2010-09-26 MED ORDER — NEOSTIGMINE METHYLSULFATE 1 MG/ML IJ SOLN
INTRAMUSCULAR | Status: DC | PRN
  Administered 2010-09-26 (×2): 2.5 mg via INTRAMUSCULAR

## 2010-09-26 SURGICAL SUPPLY — 50 items
BAG HAMPER (MISCELLANEOUS) ×3 IMPLANT
BAG SPEC RTRVL LRG 6X4 10 (ENDOMECHANICALS) ×2
BLADE MORCELLATOR LAPAROSCOPIC (BLADE) IMPLANT
BLADE SURG SZ11 CARB STEEL (BLADE) ×3 IMPLANT
CATH FOLEY 2WAY SLVR  5CC 14FR (CATHETERS) ×1
CATH FOLEY 2WAY SLVR 5CC 14FR (CATHETERS) ×2 IMPLANT
CLOTH BEACON ORANGE TIMEOUT ST (SAFETY) ×3 IMPLANT
COVER LIGHT HANDLE STERIS (MISCELLANEOUS) ×6 IMPLANT
DISSECTOR BLUNT TIP ENDO 5MM (MISCELLANEOUS) ×2 IMPLANT
ELECT REM PT RETURN 9FT ADLT (ELECTROSURGICAL) ×3
ELECTRODE REM PT RTRN 9FT ADLT (ELECTROSURGICAL) ×2 IMPLANT
FILTER SMOKE EVAC LAPAROSHD (FILTER) ×3 IMPLANT
FORMALIN 10 PREFIL 120ML (MISCELLANEOUS) ×4 IMPLANT
FORMALIN 10 PREFIL 480ML (MISCELLANEOUS) ×1 IMPLANT
GAUZE PACKING IODOFORM 1 (PACKING) IMPLANT
GAUZE SPONGE 4X4 16PLY XRAY LF (GAUZE/BANDAGES/DRESSINGS) IMPLANT
GLOVE BIOGEL PI IND STRL 8 (GLOVE) ×2 IMPLANT
GLOVE BIOGEL PI INDICATOR 8 (GLOVE) ×1
GLOVE ECLIPSE 6.5 STRL STRAW (GLOVE) ×3 IMPLANT
GLOVE ECLIPSE 7.0 STRL STRAW (GLOVE) ×3 IMPLANT
GLOVE ECLIPSE 8.0 STRL XLNG CF (GLOVE) ×3 IMPLANT
GLOVE INDICATOR 7.0 STRL GRN (GLOVE) ×2 IMPLANT
GLOVE INDICATOR 7.5 STRL GRN (GLOVE) ×4 IMPLANT
GOWN BRE IMP SLV AUR XL STRL (GOWN DISPOSABLE) ×8 IMPLANT
GOWN STRL REIN XL XLG (GOWN DISPOSABLE) ×3 IMPLANT
HAND ACTIVATED (MISCELLANEOUS) ×3 IMPLANT
INST SET LAPROSCOPIC GYN AP (KITS) ×3 IMPLANT
IV NS IRRIG 3000ML ARTHROMATIC (IV SOLUTION) ×3 IMPLANT
KIT ROOM TURNOVER APOR (KITS) ×3 IMPLANT
MANIFOLD NEPTUNE II (INSTRUMENTS) ×3 IMPLANT
NEEDLE INSUFFLATION 14GA 120MM (NEEDLE) ×3 IMPLANT
PACK PERI GYN (CUSTOM PROCEDURE TRAY) ×3 IMPLANT
PAD ARMBOARD 7.5X6 YLW CONV (MISCELLANEOUS) ×3 IMPLANT
POUCH SPECIMEN RETRIEVAL 10MM (ENDOMECHANICALS) ×2 IMPLANT
SCALPEL HARMONIC ACE (MISCELLANEOUS) ×3 IMPLANT
SET BASIN LINEN APH (SET/KITS/TRAYS/PACK) ×3 IMPLANT
SET TUBE IRRIG SUCTION NO TIP (IRRIGATION / IRRIGATOR) ×3 IMPLANT
SLEEVE Z-THREAD 5X100MM (TROCAR) ×3 IMPLANT
SOLUTION ANTI FOG 6CC (MISCELLANEOUS) ×3 IMPLANT
STAPLER VISISTAT 35W (STAPLE) ×3 IMPLANT
SUT VIC AB 2-0 CT2 27 (SUTURE) ×1 IMPLANT
SUT VIC AB 3-0 SH 27 (SUTURE)
SUT VIC AB 3-0 SH 27X BRD (SUTURE) IMPLANT
SUT VICRYL 0 UR6 27IN ABS (SUTURE) ×3 IMPLANT
SYRINGE 10CC LL (SYRINGE) ×2 IMPLANT
TROCAR Z-THRD FIOS HNDL 11X100 (TROCAR) ×3 IMPLANT
TROCAR Z-THREAD FIOS 5X100MM (TROCAR) ×3 IMPLANT
TROCAR Z-THREAD SLEEVE 11X100 (TROCAR) ×6 IMPLANT
TUBING HI FLO HEAT INSUFFLATOR (IRRIGATION / IRRIGATOR) ×3 IMPLANT
WARMER LAPAROSCOPE (MISCELLANEOUS) ×3 IMPLANT

## 2010-09-26 NOTE — Anesthesia Postprocedure Evaluation (Signed)
  Anesthesia Post-op Note  Patient: [name]  Procedure(s) Performed:  LAPAROSCOPY OPERATIVE - Laparoscopic Bilateral Salpingectomy  Patient Location: PACU  Anesthesia Type: General  Level of Consciousness: awake, alert  and oriented  Airway and Oxygen Therapy: Patient Spontanous Breathing and Patient connected to face mask oxygen  Post-op Pain: 0 /10  Post-op Assessment: Post-op Vital signs reviewed, Patient's Cardiovascular Status Stable and Respiratory Function Stable  Post-op Vital Signs: Reviewed and stable  Complications: No apparent anesthesia complications

## 2010-09-26 NOTE — Op Note (Signed)
Preoperative diagnosis: 1.  Patient desires permanent sterilization                                        2.  Previous sterilization failure  Postoperative diagnosis: Same as above + minimal endometriosis  Procedure: Laparoscopic bilateral salpingectomy  Anesthesia: Gen. endotracheal  Surgeon: Lazaro Arms   Findings:  Patient had a previous Filshie clip sterilization performed.  The right Filshie clip was occluding the right fallopian tube however the left Filshie clip was in the left mesosalpinx.  The patient did have a minimal amount  of endometriosis on the left ovary and peritoneal surfaces.  Description of operation: The patient was taken to the operating room and placed in supine position where she underwent general endotracheal anesthesia. He was then placed in low lithotomy position and prepped in the usual sterile fashion. An incision was made in the umbilicus and carried down sure plate to the fascia. A Veres needle was employed and placed into the peritoneal cavity with one pass without difficulty. Peritoneal cavity was insufflated. An 11 mm non-bladed direct visualization trocar was used and placed into the peritoneal cavity without problem with one pass. Peritoneal cavity was inspected.  Additional trochars were placed in the midline suprapubic area and also the left lower quadrant under direct visualization without difficulty. The fallopian tube on the left was grasped. The harmonic scalpel was used and a complete salpingectomy was performed. The right fallopian tube was grasped and a complete salpingectomy was performed again using the harmonic scalpel. There was good hemostasis of both surgical pedicles. The suprapubic and left lower quadrant incisions were closed using staples.  The umbilical fascia was closed with 0 Vicryl. A subcutaneous tissue suture was also placed. Skin was closed using skin staples.  The patient tolerated the procedure well.  She was taken to the recovery  room in good stable condition with all sponge instrument and needle counts being correct. The estimated blood loss for the procedure was 10 cc. The patient received 1 g of Ancef and 30 mg of Toradol preoperatively prophylactically.  Ashton Sabine H 09/26/2010 1:41 PM

## 2010-09-26 NOTE — Transfer of Care (Signed)
Immediate Anesthesia Transfer of Care Note  Patient: [name]  Procedure(s) Performed: * No procedures listed *  Patient Location: PACU  Anesthesia Type: General  Level of Consciousness: awake and alert   Airway & Oxygen Therapy: Patient Spontanous Breathing and Patient connected to face mask oxygen  Post-op Assessment: Report given to PACU RN, Post -op Vital signs reviewed and stable and Patient moving all extremities  Post vital signs: Reviewed and stable  Complications: No apparent anesthesia complications

## 2010-09-26 NOTE — Anesthesia Preprocedure Evaluation (Addendum)
Anesthesia Evaluation  Name, MR# and DOB Patient awake  General Assessment Comment  Reviewed: Allergy & Precautions, H&P  and Patient's Chart, lab work & pertinent test results  History of Anesthesia Complications Negative for: history of anesthetic complications  Airway Mallampati: II TM Distance: >3 FB Neck ROM: Full    Dental  (+) Teeth Intact   Pulmonary  asthma (inhaler prn only) and Inhaler asthma  clear to auscultation    Cardiovascular hypertension, Pt. on medications    Neuro/Psych Hx intracranial aneurysm, clipped w/o residual     GI/Hepatic/Renal   Endo/Other   Abdominal (+) obese,   Musculoskeletal  Hematology   Peds  Reproductive/Obstetrics   Anesthesia Other Findings             Anesthesia Physical Anesthesia Plan  ASA: II  Anesthesia Plan: General   Post-op Pain Management:    Induction: Intravenous  Airway Management Planned: Oral ETT  Additional Equipment:   Intra-op Plan:   Post-operative Plan: Extubation in OR  Informed Consent: I have reviewed the patients History and Physical, chart, labs and discussed the procedure including the risks, benefits and alternatives for the proposed anesthesia with the patient or authorized representative who has indicated his/her understanding and acceptance.     Plan Discussed with: CRNA  Anesthesia Plan Comments:         Anesthesia Quick Evaluation

## 2010-09-26 NOTE — OR Nursing (Signed)
Monitor unhooked pt. Up to bathroom to void

## 2010-09-26 NOTE — Anesthesia Procedure Notes (Signed)
Procedure Name: Intubation Date/Time: 09/26/2010 12:52 PM Performed by: [facility] Pre-anesthesia Checklist: Patient identified, Patient being monitored, Emergency Drugs available, Timeout performed and Suction available Patient Re-evaluated:Patient Re-evaluated prior to inductionOxygen Delivery Method: Circle System Utilized Preoxygenation: Pre-oxygenation with 100% oxygen Intubation Type: IV induction Ventilation: Mask ventilation without difficulty Laryngoscope Size: Mac and 3 Grade View: Grade I Tube type: Oral Tube size: 7.0 mm Number of attempts: 1 Airway Equipment and Method: patient positioned with wedge pillow Secured at: 22 cm Tube secured with: Tape Dental Injury: Teeth and Oropharynx as per pre-operative assessment

## 2010-10-05 ENCOUNTER — Encounter (HOSPITAL_COMMUNITY): Payer: Self-pay | Admitting: Obstetrics & Gynecology

## 2011-04-26 ENCOUNTER — Other Ambulatory Visit: Payer: Self-pay | Admitting: Obstetrics and Gynecology

## 2011-04-29 ENCOUNTER — Encounter (HOSPITAL_COMMUNITY): Payer: Self-pay | Admitting: Pharmacy Technician

## 2011-05-06 ENCOUNTER — Encounter (HOSPITAL_COMMUNITY)
Admission: RE | Admit: 2011-05-06 | Discharge: 2011-05-06 | Disposition: A | Discharge status: Home / Self Care | Payer: Medicaid Other | Source: Ambulatory Visit | Attending: Oral Surgery | Admitting: Oral Surgery

## 2011-05-06 ENCOUNTER — Ambulatory Visit (HOSPITAL_COMMUNITY)
Admission: RE | Admit: 2011-05-06 | Discharge: 2011-05-06 | Disposition: A | Discharge status: Home / Self Care | Payer: Medicaid Other | Source: Ambulatory Visit | Attending: Anesthesiology | Admitting: Anesthesiology

## 2011-05-06 ENCOUNTER — Encounter (HOSPITAL_COMMUNITY): Payer: Self-pay

## 2011-05-06 DIAGNOSIS — Z01812 Encounter for preprocedural laboratory examination: Secondary | ICD-10-CM | POA: Insufficient documentation

## 2011-05-06 DIAGNOSIS — Z01818 Encounter for other preprocedural examination: Secondary | ICD-10-CM | POA: Insufficient documentation

## 2011-05-06 DIAGNOSIS — J984 Other disorders of lung: Secondary | ICD-10-CM | POA: Insufficient documentation

## 2011-05-06 HISTORY — DX: Dorsalgia, unspecified: M54.9

## 2011-05-06 HISTORY — DX: Nocturia: R35.1

## 2011-05-06 HISTORY — DX: Hyperlipidemia, unspecified: E78.5

## 2011-05-06 HISTORY — DX: Cerebral aneurysm, nonruptured: I67.1

## 2011-05-06 LAB — CBC
MCV: 82.6 fL (ref 78.0–100.0)
Platelets: 192 10*3/uL (ref 150–400)
RBC: 4.93 MIL/uL (ref 3.87–5.11)
WBC: 12.3 10*3/uL — ABNORMAL HIGH (ref 4.0–10.5)

## 2011-05-06 LAB — BASIC METABOLIC PANEL
CO2: 23 mEq/L (ref 19–32)
Calcium: 9.9 mg/dL (ref 8.4–10.5)
Chloride: 106 mEq/L (ref 96–112)
GFR calc Af Amer: >90 mL/min (ref >90)
Sodium: 139 mEq/L (ref 135–145)

## 2011-05-06 NOTE — Progress Notes (Signed)
Pt doesn't have a cardiologist;medical MD manages HTN-Dr.McGinnis  Denies having a heart cath/echo/stress test

## 2011-05-06 NOTE — Pre-Procedure Instructions (Signed)
20 ALZADA BRAZEE  05/06/2011   Your procedure is scheduled on:  Mon, Mar 4 @ 1020 am  Report to [facility] at 0720 AM.  Call this number if you have problems the morning of surgery: [phone]   Remember:   Do not eat food:After Midnight.  May have clear liquids: up to 4 Hours before arrival.(until 3:20 am)  Clear liquids include soda, tea, black coffee, apple or grape juice, broth.  Take these medicines the morning of surgery with A SIP OF WATER: Albuterol<Bring your inhaler with you>,Aldomet,and Ultram(if needed)   Do not wear jewelry, make-up or nail polish.  Do not wear lotions, powders, or perfumes. You may wear deodorant.  Do not shave 48 hours prior to surgery.  Do not bring valuables to the hospital.  Contacts, dentures or bridgework may not be worn into surgery.  Leave suitcase in the car. After surgery it may be brought to your room.  For patients admitted to the hospital, checkout time is 11:00 AM the day of discharge.   Patients discharged the day of surgery will not be allowed to drive home.  Name and phone number of your driver:   Special Instructions: CHG Shower Use Special Wash: 1/2 bottle night before surgery and 1/2 bottle morning of surgery.   Please read over the following fact sheets that you were given: Pain Booklet, Coughing and Deep Breathing and Surgical Site Infection Prevention

## 2011-05-07 NOTE — Progress Notes (Signed)
Anesthesia please review cxr.

## 2011-05-08 NOTE — Consult Note (Signed)
Anesthesiology chart review:  [name]'s chest x-ray report was reviewed there is a very small patchy right lower lobe infiltrate of uncertain significance. She will be evaluated on the morning of surgery And assess for any respiratory difficulty or evidence of infection prior to surgery.  Kipp Brood M.D.

## 2011-05-09 NOTE — H&P (Signed)
HISTORY AND PHYSICAL  [name] is a 41 y.o. female patient with CC: painful teeth  No diagnosis found.  Past Medical History  Diagnosis Date  . Asthma   . Headache   . Hypertension     takes Triamertene and Aldomet daily  . Hyperlipidemia     not on medication yet d/t MD not calling it in yet  . Back pain   . Nocturia   . Brain aneurysm     at [redacted]wks pregnant    No current facility-administered medications for this encounter.   Current Outpatient Prescriptions  Medication Sig Dispense Refill  . traMADol (ULTRAM) 50 MG tablet Take 50-100 mg by mouth every 6 (six) hours as needed. For pain      . triamterene-hydrochlorothiazide (MAXZIDE-25) 37.5-25 MG per tablet Take 1 tablet by mouth daily.      Marland Kitchen acetaminophen (TYLENOL) 500 MG tablet Take 1,000 mg by mouth every 4 (four) hours as needed. For pain      . albuterol (PROVENTIL,VENTOLIN) 90 MCG/ACT inhaler Inhale 2 puffs into the lungs every 4 (four) hours as needed. For shortness of breath      . methyldopa (ALDOMET) 500 MG tablet Take 500 mg by mouth 2 (two) times daily.        No Known Allergies Active Problems:  * No active hospital problems. *   Vitals: There were no vitals taken for this visit. Lab results:No results found for this or any previous visit (from the past 24 hour(s)). Radiology Results: No results found. General appearance: alert, cooperative and moderately obese Head: Normocephalic, without obvious abnormality, atraumatic Eyes: conjunctivae/corneas clear. PERRL, EOM's intact. Fundi benign. Ears: normal TM's and external ear canals both ears Nose: Nares normal. Septum midline. Mucosa normal. No drainage or sinus tenderness. Throat: dental caries teeth #'s 2, 3, 4, 5, 17, 18, 30, 31, 32 Neck: no adenopathy, no carotid bruit, no JVD, supple, symmetrical, trachea midline and thyroid not enlarged, symmetric, no tenderness/mass/nodules Resp: wheezes apex - bilateral and base - left Cardio: regular rate  and rhythm, S1, S2 normal, no murmur, click, rub or gallop  Assessment:40 YO WF HTN, Asthma with nonrestorable teeth #'s 2, 3, 4, 5, 27, 18, 30, 31, 32.  Plan: Exrtact  teeth #'s 2, 3, 4, 5, 17, 18, 30, 31, 32 with general anesthesia. Day surgery.   Georgia Lopes 05/09/2011

## 2011-05-13 ENCOUNTER — Ambulatory Visit (HOSPITAL_COMMUNITY): Payer: Medicaid Other | Admitting: Anesthesiology

## 2011-05-13 ENCOUNTER — Encounter (HOSPITAL_COMMUNITY): Payer: Self-pay | Admitting: Anesthesiology

## 2011-05-13 ENCOUNTER — Encounter (HOSPITAL_COMMUNITY)
Admission: RE | Disposition: A | Discharge status: Home / Self Care | Payer: Self-pay | Source: Ambulatory Visit | Attending: Oral Surgery

## 2011-05-13 ENCOUNTER — Ambulatory Visit (HOSPITAL_COMMUNITY)
Admission: RE | Admit: 2011-05-13 | Discharge: 2011-05-13 | Disposition: A | Discharge status: Home / Self Care | Payer: Medicaid Other | Source: Ambulatory Visit | Attending: Oral Surgery | Admitting: Oral Surgery

## 2011-05-13 DIAGNOSIS — Z01818 Encounter for other preprocedural examination: Secondary | ICD-10-CM | POA: Insufficient documentation

## 2011-05-13 DIAGNOSIS — I10 Essential (primary) hypertension: Secondary | ICD-10-CM | POA: Insufficient documentation

## 2011-05-13 DIAGNOSIS — E785 Hyperlipidemia, unspecified: Secondary | ICD-10-CM | POA: Insufficient documentation

## 2011-05-13 DIAGNOSIS — K011 Impacted teeth: Secondary | ICD-10-CM

## 2011-05-13 DIAGNOSIS — R51 Headache: Secondary | ICD-10-CM | POA: Insufficient documentation

## 2011-05-13 DIAGNOSIS — J45909 Unspecified asthma, uncomplicated: Secondary | ICD-10-CM | POA: Insufficient documentation

## 2011-05-13 DIAGNOSIS — K029 Dental caries, unspecified: Secondary | ICD-10-CM

## 2011-05-13 DIAGNOSIS — Z01810 Encounter for preprocedural cardiovascular examination: Secondary | ICD-10-CM | POA: Insufficient documentation

## 2011-05-13 DIAGNOSIS — Z01812 Encounter for preprocedural laboratory examination: Secondary | ICD-10-CM | POA: Insufficient documentation

## 2011-05-13 DIAGNOSIS — Z79899 Other long term (current) drug therapy: Secondary | ICD-10-CM | POA: Insufficient documentation

## 2011-05-13 DIAGNOSIS — K006 Disturbances in tooth eruption: Secondary | ICD-10-CM | POA: Insufficient documentation

## 2011-05-13 HISTORY — PX: MULTIPLE EXTRACTIONS WITH ALVEOLOPLASTY: SHX5342

## 2011-05-13 SURGERY — MULTIPLE EXTRACTION WITH ALVEOLOPLASTY
Anesthesia: General | Site: Mouth | Laterality: Bilateral | Wound class: Clean Contaminated

## 2011-05-13 MED ORDER — NEOSTIGMINE METHYLSULFATE 1 MG/ML IJ SOLN
INTRAMUSCULAR | Status: DC | PRN
  Administered 2011-05-13: 4 mg via INTRAVENOUS

## 2011-05-13 MED ORDER — GLYCOPYRROLATE 0.2 MG/ML IJ SOLN
INTRAMUSCULAR | Status: DC | PRN
  Administered 2011-05-13: .7 mg via INTRAVENOUS

## 2011-05-13 MED ORDER — FENTANYL CITRATE 0.05 MG/ML IJ SOLN
INTRAMUSCULAR | Status: DC | PRN
  Administered 2011-05-13: 125 ug via INTRAVENOUS

## 2011-05-13 MED ORDER — SODIUM CHLORIDE 0.9 % IR SOLN
Status: DC | PRN
  Administered 2011-05-13: 1000 mL

## 2011-05-13 MED ORDER — LACTATED RINGERS IV SOLN
INTRAVENOUS | Status: DC
  Administered 2011-05-13: 11:00:00 via INTRAVENOUS

## 2011-05-13 MED ORDER — OXYMETAZOLINE HCL 0.05 % NA SOLN
NASAL | Status: DC | PRN
  Administered 2011-05-13: 2 via NASAL

## 2011-05-13 MED ORDER — CEFAZOLIN SODIUM 1-5 GM-% IV SOLN
INTRAVENOUS | Status: DC | PRN
  Administered 2011-05-13: 1 g via INTRAVENOUS

## 2011-05-13 MED ORDER — ONDANSETRON HCL 4 MG/2ML IJ SOLN: 4.0000 mg | Freq: Four times a day (QID) | INTRAMUSCULAR | Status: DC | PRN

## 2011-05-13 MED ORDER — LACTATED RINGERS IV SOLN
INTRAVENOUS | Status: DC | PRN
  Administered 2011-05-13: 11:00:00 via INTRAVENOUS

## 2011-05-13 MED ORDER — OXYCODONE-ACETAMINOPHEN 5-325 MG PO TABS: 1.0000 | ORAL_TABLET | ORAL | Status: AC | PRN

## 2011-05-13 MED ORDER — CEFAZOLIN SODIUM 1-5 GM-% IV SOLN
INTRAVENOUS | Status: AC
  Filled 2011-05-13: qty 50

## 2011-05-13 MED ORDER — PROPOFOL 10 MG/ML IV EMUL
INTRAVENOUS | Status: DC | PRN
  Administered 2011-05-13: 180 mg via INTRAVENOUS

## 2011-05-13 MED ORDER — ROCURONIUM BROMIDE 100 MG/10ML IV SOLN
INTRAVENOUS | Status: DC | PRN
  Administered 2011-05-13: 35 mg via INTRAVENOUS

## 2011-05-13 MED ORDER — HYDROMORPHONE HCL PF 1 MG/ML IJ SOLN
0.2500 mg | INTRAMUSCULAR | Status: DC | PRN
  Administered 2011-05-13: 0.5 mg via INTRAVENOUS

## 2011-05-13 MED ORDER — LIDOCAINE-EPINEPHRINE 2 %-1:100000 IJ SOLN
INTRAMUSCULAR | Status: DC | PRN
  Administered 2011-05-13: 10 mL

## 2011-05-13 SURGICAL SUPPLY — 28 items
BUR CROSS CUT FISSURE 1.6 (BURR) ×2 IMPLANT
BUR EGG ELITE 4.0 (BURR) ×2 IMPLANT
CANISTER SUCTION 2500CC (MISCELLANEOUS) ×2 IMPLANT
CLOTH BEACON ORANGE TIMEOUT ST (SAFETY) ×2 IMPLANT
COVER SURGICAL LIGHT HANDLE (MISCELLANEOUS) ×2 IMPLANT
CRADLE DONUT ADULT HEAD (MISCELLANEOUS) ×2 IMPLANT
DECANTER SPIKE VIAL GLASS SM (MISCELLANEOUS) ×2 IMPLANT
GAUZE PACKING FOLDED 2  STR (GAUZE/BANDAGES/DRESSINGS) ×1
GAUZE PACKING FOLDED 2 STR (GAUZE/BANDAGES/DRESSINGS) ×1 IMPLANT
GLOVE BIO SURGEON STRL SZ 6.5 (GLOVE) ×2 IMPLANT
GLOVE BIO SURGEON STRL SZ7 (GLOVE) IMPLANT
GLOVE BIO SURGEON STRL SZ7.5 (GLOVE) ×2 IMPLANT
GLOVE BIOGEL PI IND STRL 7.0 (GLOVE) ×1 IMPLANT
GLOVE BIOGEL PI INDICATOR 7.0 (GLOVE) ×1
GOWN STRL NON-REIN LRG LVL3 (GOWN DISPOSABLE) ×4 IMPLANT
GOWN STRL REIN XL XLG (GOWN DISPOSABLE) ×2 IMPLANT
KIT BASIN OR (CUSTOM PROCEDURE TRAY) ×2 IMPLANT
KIT ROOM TURNOVER OR (KITS) ×2 IMPLANT
NEEDLE 22X1 1/2 (OR ONLY) (NEEDLE) ×2 IMPLANT
NS IRRIG 1000ML POUR BTL (IV SOLUTION) ×2 IMPLANT
PAD ARMBOARD 7.5X6 YLW CONV (MISCELLANEOUS) ×4 IMPLANT
SUT CHROMIC 3 0 PS 2 (SUTURE) ×2 IMPLANT
SYR 50ML SLIP (SYRINGE) IMPLANT
TOWEL OR 17X26 10 PK STRL BLUE (TOWEL DISPOSABLE) ×2 IMPLANT
TRAY ENT MC OR (CUSTOM PROCEDURE TRAY) ×2 IMPLANT
TUBING IRRIGATION (MISCELLANEOUS) IMPLANT
WATER STERILE IRR 1000ML POUR (IV SOLUTION) IMPLANT
YANKAUER SUCT BULB TIP NO VENT (SUCTIONS) ×2 IMPLANT

## 2011-05-13 NOTE — H&P (Signed)
H&P documentation  -History and Physical Reviewed  -Patient has been re-examined  -No change in the plan of care  [name] M  

## 2011-05-13 NOTE — Transfer of Care (Signed)
Immediate Anesthesia Transfer of Care Note  Patient: [name]  Procedure(s) Performed: Procedure(s) (LRB): MULTIPLE EXTRACION WITH ALVEOLOPLASTY (Bilateral)  Patient Location: PACU  Anesthesia Type: General  Level of Consciousness: awake, alert  and oriented  Airway & Oxygen Therapy: Patient Spontanous Breathing  Post-op Assessment: Report given to PACU RN, Post -op Vital signs reviewed and stable and Patient moving all extremities X 4  Post vital signs: Reviewed and stable  Complications: No apparent anesthesia complications

## 2011-05-13 NOTE — Anesthesia Preprocedure Evaluation (Addendum)
Anesthesia Evaluation  Patient identified by MRN, date of birth, ID band Patient awake    Reviewed: Allergy & Precautions, H&P , NPO status , Patient's Chart, lab work & pertinent test results  Airway Mallampati: II TM Distance: >3 FB Neck ROM: full    Dental  (+) Poor Dentition, Chipped and Dental Advisory Given   Pulmonary asthma ,          Cardiovascular hypertension,     Neuro/Psych  Headaches,    GI/Hepatic   Endo/Other    Renal/GU      Musculoskeletal   Abdominal   Peds  Hematology   Anesthesia Other Findings   Reproductive/Obstetrics                          Anesthesia Physical Anesthesia Plan  ASA: II  Anesthesia Plan: General   Post-op Pain Management:    Induction: Intravenous  Airway Management Planned: Nasal ETT  Additional Equipment:   Intra-op Plan:   Post-operative Plan: Extubation in OR  Informed Consent: I have reviewed the patients History and Physical, chart, labs and discussed the procedure including the risks, benefits and alternatives for the proposed anesthesia with the patient or authorized representative who has indicated his/her understanding and acceptance.     Plan Discussed with: CRNA and Surgeon  Anesthesia Plan Comments:         Anesthesia Quick Evaluation

## 2011-05-13 NOTE — Anesthesia Postprocedure Evaluation (Signed)
Anesthesia Post Note  Patient: [name]  Procedure(s) Performed: Procedure(s) (LRB): MULTIPLE EXTRACION WITH ALVEOLOPLASTY (Bilateral)  Anesthesia type: General  Patient location: PACU  Post pain: Pain level controlled and Adequate analgesia  Post assessment: Post-op Vital signs reviewed, Patient's Cardiovascular Status Stable, Respiratory Function Stable, Patent Airway and Pain level controlled  Last Vitals:  Filed Vitals:   05/13/11 1230  BP: 135/80  Pulse: 89  Temp: 36.4 C  Resp: 21    Post vital signs: Reviewed and stable  Level of consciousness: awake, alert  and oriented  Complications: No apparent anesthesia complications

## 2011-05-13 NOTE — Op Note (Signed)
05/13/2011  12:24 PM  PATIENT:  [name]  41 y.o. female  PRE-OPERATIVE DIAGNOSIS:  NONRESTORABLE TEETH #'s 2, 3, 4, 5,18, 30, Impacted teeth #'s 17, 32  POST-OPERATIVE DIAGNOSIS:  SAME  PROCEDURE:  Procedure(s): MULTIPLE EXTRACTION TEETH #'s  2, 3, 4, 5,18, 30, 17, 32 WITH ALVEOLOPLASTY  SURGEON:  Surgeon(s): Georgia Lopes, DDS  ANESTHESIA:   local and general  EBL:  minimal  DRAINS: none   LOCAL MEDICATIONS USED:  LIDOCAINE 10 CC  SPECIMEN:  No Specimen  COUNTS:  YES  PLAN OF CARE: Discharge to home after PACU  PATIENT DISPOSITION:  PACU - hemodynamically stable.   PROCEDURE DETAILS: Dictation #   Georgia Lopes, DMD 05/13/2011 12:24 PM

## 2011-05-14 NOTE — Op Note (Signed)
NAMECYRIAH, [name]              ACCOUNT NO.:  [id number]  MEDICAL RECORD NO.:  [id number]  LOCATION:  [facility]                         FACILITY:  [facility]  PHYSICIAN:  Georgia Lopes, M.D.  DATE OF BIRTH:  09-Jun-1970  DATE OF PROCEDURE:  05/13/2011 DATE OF DISCHARGE:  05/13/2011                              OPERATIVE REPORT   PREOPERATIVE DIAGNOSIS:  Nonrestorable and impacted teeth numbers 2, 3, 4, 5, 18, 30 and impacted teeth numbers 17 and 32.  POSTOPERATIVE DIAGNOSIS:  Nonrestorable and impacted teeth numbers 2, 3, 4, 5, 18, 30 and impacted teeth numbers 17 and 32.  PROCEDURES:  Removal of teeth numbers 2, 3, 4, 5, 17, 18, 30 and 32 with alveoplasty.  SURGEON:  Georgia Lopes, MD  ANESTHESIA:  General, nasal intubation.  INDICATION FOR PROCEDURE:  The patient is a 41 year old female who is referred to me by her general dentist.  She has a past medical history of CVA in April 2012, hypertension, asthma.  She had two completely bony impacted teeth and other nonrestorable teeth secondary to dental caries. Because of the patient's past medical history and the need for adequate anesthesia, general anesthesia was recommended with intubation for airway protection.  Surgical clearance was obtained from the neurosurgeon and the patient elected to proceed.  PROCEDURE:  The patient was taken to the operating room and placed on the table in supine position.  General anesthesia was administered intravenously and nasal endotracheal tube was placed and secured.  The eyes were protected.  The patient was draped for the procedure.  Time- out was performed.  Posterior pharynx was suctioned.  A throat pack was placed.  Then, 2% lidocaine with 1:100,000 epinephrine was infiltrated in an inferior alveolar block on the right and left side and then buccal and palatal infiltration in the right posterior maxilla, total of 10 mL of solution was utilized.  A bite block was placed on the right side  of the mouth and a sweetheart retractor was used to retract the tongue.  A #15-blade was used to make a full-thickness incision overlying tooth number 17, which was impacted in the bone.  The incision was carried forward to tooth number 19, tooth number 18 was deeply decayed.  The periosteum was reflected from around these teeth and then the Stryker handpiece with fissure bur was used to remove the bone overlying tooth number 17 and around tooth number 18 to section the tooth.  Tooth number 18 was removed with the 301 elevator, tooth number 17 required multiple sections and was eventually removed with a 301 elevator and rongeur. The socket was then curetted and irrigated and closed with 3-0 chromic. The bite block and sweetheart were repositioned to the other side of the mouth.  Attention was turned to the right side.  A #15-blade was used to make a full-thickness incision overlying tooth number 32 and carrying anteriorly to tooth number 29.  The periosteum was reflected lingually and buccally to expose the teeth.  Bone was removed around tooth number 30.  The tooth was sectioned and removed in multiple pieces.  Tooth number 32 was removed by removing overlying bone with the Stryker handpiece.  The tooth  was then sectioned in multiple pieces and removed with the rongeurs.  The sockets were then curetted.  Egg-shaped bur was used to perform an alveoplasty in this area and then the area was closed with 3-0 chromic in the right maxilla.  A #15-blade was used to make a full-thickness incision overlying tooth number 2 and carrying anteriorly to tooth number 6 and at the distal aspect of this tooth on the buccal and palatal aspects.  The periosteal elevator was used to reflect the periosteum.  Interproximal bone was removed with the Stryker handpiece. The teeth were elevated and removed with the upper universal forceps. The sockets were curetted and then alveoplasty was performed in the right  maxilla with the egg-shaped bur.  Then, the area was irrigated and closed with 3-0 chromic.  The oral cavity was inspected, found to have good contour and closure.  The oral cavity was irrigated, suctioned, and throat pack was removed.  The patient was awakened, taken to the recovery room and breathing spontaneously in good condition.  EBL:  Minimum.  COMPLICATIONS:  None.  SPECIMENS:  None.     Georgia Lopes, M.D.     SMJ/MEDQ  D:  05/13/2011  T:  05/14/2011  Job:  [id number]

## 2011-05-15 ENCOUNTER — Encounter (HOSPITAL_COMMUNITY): Payer: Self-pay | Admitting: Oral Surgery

## 2011-07-10 ENCOUNTER — Encounter (HOSPITAL_COMMUNITY): Payer: Self-pay | Admitting: *Deleted

## 2011-07-10 ENCOUNTER — Emergency Department (HOSPITAL_COMMUNITY)
Admission: EM | Admit: 2011-07-10 | Discharge: 2011-07-10 | Disposition: A | Discharge status: Home / Self Care | Payer: Medicaid Other | Attending: Emergency Medicine | Admitting: Emergency Medicine

## 2011-07-10 ENCOUNTER — Emergency Department (HOSPITAL_COMMUNITY): Payer: Medicaid Other

## 2011-07-10 DIAGNOSIS — M549 Dorsalgia, unspecified: Secondary | ICD-10-CM | POA: Insufficient documentation

## 2011-07-10 DIAGNOSIS — W1809XA Striking against other object with subsequent fall, initial encounter: Secondary | ICD-10-CM | POA: Insufficient documentation

## 2011-07-10 DIAGNOSIS — Y92009 Unspecified place in unspecified non-institutional (private) residence as the place of occurrence of the external cause: Secondary | ICD-10-CM | POA: Insufficient documentation

## 2011-07-10 DIAGNOSIS — S39012A Strain of muscle, fascia and tendon of lower back, initial encounter: Secondary | ICD-10-CM

## 2011-07-10 DIAGNOSIS — F17200 Nicotine dependence, unspecified, uncomplicated: Secondary | ICD-10-CM | POA: Insufficient documentation

## 2011-07-10 DIAGNOSIS — S335XXA Sprain of ligaments of lumbar spine, initial encounter: Secondary | ICD-10-CM | POA: Insufficient documentation

## 2011-07-10 LAB — POCT PREGNANCY, URINE: Preg Test, Ur: NEGATIVE

## 2011-07-10 MED ORDER — CYCLOBENZAPRINE HCL 10 MG PO TABS: 5.0000 mg | ORAL_TABLET | Freq: Three times a day (TID) | ORAL | Status: AC | PRN

## 2011-07-10 MED ORDER — HYDROMORPHONE HCL PF 1 MG/ML IJ SOLN
1.0000 mg | Freq: Once | INTRAMUSCULAR | Status: AC
  Administered 2011-07-10: 1 mg via INTRAMUSCULAR
  Filled 2011-07-10: qty 1

## 2011-07-10 MED ORDER — PREDNISONE 20 MG PO TABS
60.0000 mg | ORAL_TABLET | Freq: Once | ORAL | Status: AC
  Administered 2011-07-10: 60 mg via ORAL
  Filled 2011-07-10: qty 3

## 2011-07-10 MED ORDER — HYDROCODONE-ACETAMINOPHEN 5-325 MG PO TABS: 1.0000 | ORAL_TABLET | ORAL | Status: AC | PRN

## 2011-07-10 MED ORDER — PREDNISONE 10 MG PO TABS: ORAL_TABLET | ORAL | Status: DC

## 2011-07-10 MED ORDER — CYCLOBENZAPRINE HCL 10 MG PO TABS
10.0000 mg | ORAL_TABLET | Freq: Once | ORAL | Status: AC
  Administered 2011-07-10: 10 mg via ORAL
  Filled 2011-07-10: qty 1

## 2011-07-10 NOTE — ED Provider Notes (Signed)
History     CSN: [id number]  Arrival date & time 07/10/11  1243   First MD Initiated Contact with Patient 07/10/11 1401      Chief Complaint  Patient presents with  . Back Pain    (Consider location/radiation/quality/duration/timing/severity/associated sxs/prior treatment) HPI Comments: [name] presents with evaluation of injury to her lower back.  She fell backward this morning striking her left lower back on a chair as she tried to avoid colliding with her 53-year-old child.  She does have a history of chronic low back pain with intermittent exacerbations.  She describes the pain as sharp and stabbing without radiation.  She has been unable to find a comfortable position since her fall.  Palpation also makes worse.  She has taken Tylenol and tramadol without relief of symptoms.  She denies weakness or numbness in her legs and she has had no loss of control of bowels or bladder including retention.  Patient is a 41 y.o. female presenting with back pain. The history is provided by the patient.  Back Pain  Pertinent negatives include no chest pain, no fever, no numbness, no abdominal pain, no dysuria and no weakness.    Past Medical History  Diagnosis Date  . Asthma   . Headache   . Hypertension     takes Triamertene and Aldomet daily  . Hyperlipidemia     not on medication yet d/t MD not calling it in yet  . Back pain   . Nocturia   . Brain aneurysm     at [redacted]wks pregnant    Past Surgical History  Procedure Date  . Intracranial aneurysm repair 06-16-2010  . Cesarean section 2012  . Laparoscopy 09/26/2010    Procedure: LAPAROSCOPY OPERATIVE;  Surgeon: Lazaro Arms, MD;  Location: [facility];  Service: Gynecology;  Laterality: N/A;  Laparoscopic Bilateral Salpingectomy  . Tonsillectomy     as a child  . Multiple extractions with alveoloplasty 05/13/2011    Procedure: MULTIPLE EXTRACION WITH ALVEOLOPLASTY;  Surgeon: Georgia Lopes, DDS;  Location: [facility];  Service: Oral  Surgery;  Laterality: Bilateral;  extraction teeth #'s 2,3,4,5,17,18,30,31, and 32 with alveoloplasty.    Family History  Problem Relation Age of Onset  . Anesthesia problems Neg Hx   . Hypotension Neg Hx   . Malignant hyperthermia Neg Hx   . Pseudochol deficiency Neg Hx   . Hypertension Father     History  Substance Use Topics  . Smoking status: Current Everyday Smoker -- 1.0 packs/day for 15 years    Types: Cigarettes  . Smokeless tobacco: Not on file  . Alcohol Use: No    OB History    Grav Para Term Preterm Abortions TAB SAB Ect Mult Living                  Review of Systems  Constitutional: Negative for fever.  Respiratory: Negative for shortness of breath.   Cardiovascular: Negative for chest pain and leg swelling.  Gastrointestinal: Negative for abdominal pain, constipation and abdominal distention.  Genitourinary: Negative for dysuria, urgency, frequency, flank pain and difficulty urinating.  Musculoskeletal: Positive for back pain. Negative for joint swelling and gait problem.  Skin: Negative for rash.  Neurological: Negative for weakness and numbness.    Allergies  Review of patient's allergies indicates no known allergies.  Home Medications   Current Outpatient Rx  Name Route Sig Dispense Refill  . ACETAMINOPHEN 500 MG PO TABS Oral Take 1,000 mg by mouth every  4 (four) hours as needed. For pain    . ALBUTEROL 90 MCG/ACT IN AERS Inhalation Inhale 2 puffs into the lungs every 4 (four) hours as needed. For shortness of breath    . CYCLOBENZAPRINE HCL 10 MG PO TABS Oral Take 0.5 tablets (5 mg total) by mouth 3 (three) times daily as needed for muscle spasms. 15 tablet 0  . HYDROCODONE-ACETAMINOPHEN 5-325 MG PO TABS Oral Take 1 tablet by mouth every 4 (four) hours as needed for pain. 20 tablet 0  . METHYLDOPA 500 MG PO TABS Oral Take 500 mg by mouth 2 (two) times daily.     Marland Kitchen PREDNISONE 10 MG PO TABS  6, 5, 4, 3, 2 then 1 tablet by mouth daily for 6 days total.  21 tablet 0  . TRAMADOL HCL 50 MG PO TABS Oral Take 50-100 mg by mouth every 6 (six) hours as needed. For pain    . TRIAMTERENE-HCTZ 37.5-25 MG PO TABS Oral Take 1 tablet by mouth daily.      BP 127/86  Pulse 93  Temp(Src) 98.2 F (36.8 C) (Oral)  Resp 18  Ht 5\' 5"  (1.651 m)  Wt 186 lb (84.369 kg)  BMI 30.95 kg/m2  SpO2 100%  LMP 07/08/2011  Physical Exam  Nursing note and vitals reviewed. Constitutional: She appears well-developed and well-nourished.  HENT:  Head: Normocephalic.  Eyes: Conjunctivae are normal.  Neck: Normal range of motion. Neck supple.  Cardiovascular: Normal rate and intact distal pulses.        Pedal pulses normal.  Pulmonary/Chest: Effort normal.  Abdominal: Soft. Bowel sounds are normal. She exhibits no distension and no mass.  Musculoskeletal: Normal range of motion. She exhibits no edema.       Lumbar back: She exhibits tenderness. She exhibits no swelling, no edema and no spasm.       Back:  Neurological: She is alert. She has normal strength. She displays no atrophy and no tremor. No sensory deficit. Gait normal.  Reflex Scores:      Patellar reflexes are 2+ on the right side and 2+ on the left side.      Achilles reflexes are 2+ on the right side and 2+ on the left side.      No strength deficit noted in hip and knee flexor and extensor muscle groups.  Ankle flexion and extension intact.  Skin: Skin is warm and dry.  Psychiatric: She has a normal mood and affect.    ED Course  Procedures (including critical care time)   Labs Reviewed  POCT PREGNANCY, URINE   Dg Lumbar Spine Complete  07/10/2011  *RADIOLOGY REPORT*  Clinical Data: Fall.  Low back pain.  LUMBAR SPINE - COMPLETE 4+ VIEW  Comparison: 02/13/2010  Findings: Four views study shows no evidence for fracture.  No subluxation.  As before, there is loss of disc height at L4-5 and L5-S1.  No worrisome lytic or sclerotic osseous abnormality. Facets are well-aligned bilaterally.  SI joints  are unremarkable.  IMPRESSION: Stable exam.  Loss of disc height at L4-5 and L5-L1.  Original Report Authenticated By: ERIC A. MANSELL, M.D.     1. Lumbar strain     Patient was given Dilaudid 1 mg IM, Flexeril 10 mg by mouth and prednisone 60 mg by mouth.   MDM  Lumbar strain and contusion secondary to fall in a patient with a history of low back pain.  Patient did obtain moderate relief of pain going from 9/10-5/10 after treatment.  She was prescribed prednisone taper, Flexeril and hydrocodone in place of her tramadol for pain relief.  Low back pain instructions given.  Referral back to PCP for recheck if not improving over the next 5 days.  No neuro deficit on exam or by history to suggest emergent or surgical presentation.           Burgess Amor, [place] 07/10/11 1816

## 2011-07-10 NOTE — ED Notes (Signed)
Low back pain, Fell backward and struck low back on chair this am.

## 2011-07-10 NOTE — Discharge Instructions (Signed)
Back Pain, Adult Low back pain is very common. About 1 in 5 people have back pain.The cause of low back pain is rarely dangerous. The pain often gets better over time.About half of people with a sudden onset of back pain feel better in just 2 weeks. About 8 in 10 people feel better by 6 weeks.  CAUSES Some common causes of back pain include:  Strain of the muscles or ligaments supporting the spine.   Wear and tear (degeneration) of the spinal discs.   Arthritis.   Direct injury to the back.  DIAGNOSIS Most of the time, the direct cause of low back pain is not known.However, back pain can be treated effectively even when the exact cause of the pain is unknown.Answering your caregiver's questions about your overall health and symptoms is one of the most accurate ways to make sure the cause of your pain is not dangerous. If your caregiver needs more information, he or she may order lab work or imaging tests (X-rays or MRIs).However, even if imaging tests show changes in your back, this usually does not require surgery. HOME CARE INSTRUCTIONS For many people, back pain returns.Since low back pain is rarely dangerous, it is often a condition that people can learn to manageon their own.   Remain active. It is stressful on the back to sit or stand in one place. Do not sit, drive, or stand in one place for more than 30 minutes at a time. Take short walks on level surfaces as soon as pain allows.Try to increase the length of time you walk each day.   Do not stay in bed.Resting more than 1 or 2 days can delay your recovery.   Do not avoid exercise or work.Your body is made to move.It is not dangerous to be active, even though your back may hurt.Your back will likely heal faster if you return to being active before your pain is gone.   Pay attention to your body when you bend and lift. Many people have less discomfortwhen lifting if they bend their knees, keep the load close to their  bodies,and avoid twisting. Often, the most comfortable positions are those that put less stress on your recovering back.   Find a comfortable position to sleep. Use a firm mattress and lie on your side with your knees slightly bent. If you lie on your back, put a pillow under your knees.   Only take over-the-counter or prescription medicines as directed by your caregiver. Over-the-counter medicines to reduce pain and inflammation are often the most helpful.Your caregiver may prescribe muscle relaxant drugs.These medicines help dull your pain so you can more quickly return to your normal activities and healthy exercise.   Put ice on the injured area.   Put ice in a plastic bag.   Place a towel between your skin and the bag.   Leave the ice on for 15 to 20 minutes, 3 to 4 times a day for the first 2 to 3 days. After that, ice and heat may be alternated to reduce pain and spasms.   Ask your caregiver about trying back exercises and gentle massage. This may be of some benefit.   Avoid feeling anxious or stressed.Stress increases muscle tension and can worsen back pain.It is important to recognize when you are anxious or stressed and learn ways to manage it.Exercise is a great option.  SEEK MEDICAL CARE IF:  You have pain that is not relieved with rest or medicine.   You have   pain that does not improve in 1 week.   You have new symptoms.   You are generally not feeling well.  SEEK IMMEDIATE MEDICAL CARE IF:   You have pain that radiates from your back into your legs.   You develop new bowel or bladder control problems.   You have unusual weakness or numbness in your arms or legs.   You develop nausea or vomiting.   You develop abdominal pain.   You feel faint.  Document Released: 02/25/2005 Document Revised: 02/14/2011 Document Reviewed: 07/16/2010 [facility] Patient Information 2012 [place], [place].   Take your next dose of prednisone tomorrow morning.  Use the the other  medicines as directed.  Do not drive within 4 hours of taking hydrocodone as this will make you drowsy.  Avoid lifting,  Bending,  Twisting or any other activity that worsens your pain over the next week.  Apply an  icepack  to your lower back for 10-15 minutes every 2 hours for the next 2 days.  You should get rechecked if your symptoms are not better over the next 5 days,  Or you develop increased pain,  Weakness in your leg(s) or loss of bladder or bowel function - these are symptoms of a worse injury.

## 2011-07-10 NOTE — ED Provider Notes (Signed)
Medical screening examination/treatment/procedure(s) were performed by non-physician practitioner and as supervising physician I was immediately available for consultation/collaboration.   Carleene Cooper III, MD 07/10/11 2008

## 2012-03-13 ENCOUNTER — Ambulatory Visit (HOSPITAL_COMMUNITY)
Admission: RE | Admit: 2012-03-13 | Discharge: 2012-03-13 | Disposition: A | Discharge status: Home / Self Care | Payer: Medicaid Other | Source: Ambulatory Visit | Attending: Nurse Practitioner | Admitting: Nurse Practitioner

## 2012-03-13 DIAGNOSIS — M6281 Muscle weakness (generalized): Secondary | ICD-10-CM | POA: Insufficient documentation

## 2012-03-13 DIAGNOSIS — R262 Difficulty in walking, not elsewhere classified: Secondary | ICD-10-CM | POA: Insufficient documentation

## 2012-03-13 DIAGNOSIS — R29898 Other symptoms and signs involving the musculoskeletal system: Secondary | ICD-10-CM | POA: Insufficient documentation

## 2012-03-13 DIAGNOSIS — IMO0001 Reserved for inherently not codable concepts without codable children: Secondary | ICD-10-CM | POA: Insufficient documentation

## 2012-03-13 NOTE — Evaluation (Signed)
Physical Therapy Evaluation  Patient Details  Name: [name] MRN: [id number] Date of Birth: Aug 26, 1970  Today's Date: 03/13/2012 Time: 1108-1150 PT Time Calculation (min): 42 min  Visit#: 1  of 4   Re-eval:   Assessment Diagnosis: LBP Next MD Visit: 03/30/2012 Prior Therapy: none  Authorization: medicaid   Authorization Visit#: 0  of 3    Past Medical History:  Past Medical History  Diagnosis Date  . Asthma   . Headache   . Hypertension     takes Triamertene and Aldomet daily  . Hyperlipidemia     not on medication yet d/t MD not calling it in yet  . Back pain   . Nocturia   . Brain aneurysm     at [redacted]wks pregnant   Past Surgical History:  Past Surgical History  Procedure Date  . Intracranial aneurysm repair 06-16-2010  . Cesarean section 2012  . Laparoscopy 09/26/2010    Procedure: LAPAROSCOPY OPERATIVE;  Surgeon: Lazaro Arms, MD;  Location: [facility];  Service: Gynecology;  Laterality: N/A;  Laparoscopic Bilateral Salpingectomy  . Tonsillectomy     as a child  . Multiple extractions with alveoloplasty 05/13/2011    Procedure: MULTIPLE EXTRACION WITH ALVEOLOPLASTY;  Surgeon: Georgia Lopes, DDS;  Location: [facility];  Service: Oral Surgery;  Laterality: Bilateral;  extraction teeth #'s 2,3,4,5,17,18,30,31, and 32 with alveoloplasty.    Subjective Symptoms/Limitations Symptoms: [name] states that she has had  progressive low back pain for several years. The patient states that the pain is equal bilaterally.  She has pain that goes into her left leg to the thigh level about once a week. She is being referred to PT to help to decrease her sx of pain. How long can you sit comfortably?: increased back pain after 15 minutes How long can you stand comfortably?: 15 minutes  How long can you walk comfortably?: 20-30'  Pain Assessment Currently in Pain?: Yes Pain Score:   5 (worst 7; best 5/10) Pain Location: Back Pain Orientation: Right;Left;Lower Pain Type: Chronic  pain   Prior Functioning  Prior Function Vocation: Unemployed Leisure: Hobbies-yes (Comment) Comments: reading  Cognition/Observation Cognition Overall Cognitive Status: Appears within functional limits for tasks assessed  Sensation/Coordination/Flexibility/Functional Tests Flexibility 90/90: Positive  Assessment RLE Strength Right Hip Flexion: 3+/5 Right Knee Flexion: 5/5 Right Knee Extension: 3+/5 Right Ankle Dorsiflexion: 3+/5 LLE Strength Left Hip Flexion: 4/5 Left Knee Flexion: 5/5 Left Knee Extension: 4/5 Left Ankle Dorsiflexion: 4/5 Lumbar AROM Lumbar Flexion: decreased 30% reps pulling up makes worse. Lumbar Extension: wnl reps no change Lumbar - Right Side Bend: wnl Lumbar - Left Side Bend: wnl Lumbar - Right Rotation: decreased 20% Lumbar - Left Rotation: decreased 20%  Exercise/Treatments Mobility/Balance  Posture/Postural Control Posture/Postural Control: No significant limitations   Stretches Active Hamstring Stretch: 3 reps;30 seconds Single Knee to Chest Stretch: 3 reps;30 seconds   Seated Other Seated Lumbar Exercises: ab sets; kegals and hip roll in/outs x 10 @  Physical Therapy Assessment and Plan PT Assessment and Plan Clinical Impression Statement: Pt with signs and sx of instability who will benefit from education on core strengthening. Pt will benefit from skilled therapeutic intervention in order to improve on the following deficits: Pain;Decreased strength;Difficulty walking Rehab Potential: Good PT Frequency: Min 1X/week PT Duration: 4 weeks PT Treatment/Interventions: Therapeutic activities;Therapeutic exercise PT Plan: begin bent knee raise, bridge, clam, isometric hip flexion next treatment; 2nd visit begin SLR; prone heel squeeze, SLR, opposite arm/leg raise.    Goals  Home Exercise Program Pt will Perform Home Exercise Program: Independently PT Short Term Goals Time to Complete Short Term Goals: 3 weeks PT Short Term Goal 1:  Pt to be able to walk for 40 minutes without increased pain PT Short Term Goal 2: Pt to be able to sit for 30 mintues without increased pain PT Short Term Goal 3: Pt to be able to stand for 20 minutes to allow pt to perform a small meal or grooming activites without pain.  Problem List Patient Active Problem List  Diagnosis  . Difficulty in walking  . Bilateral leg weakness    PT - End of Session Activity Tolerance: Patient tolerated treatment well General Behavior During Session: [facility] for tasks performed Cognition: [facility] for tasks performed PT Plan of Care PT Home Exercise Plan: given  GP    RUSSELL,CINDY 03/13/2012, 12:05 PM  Physician Documentation Your signature is required to indicate approval of the treatment plan as stated above.  Please sign and either send electronically or make a copy of this report for your files and return this physician signed original.   Please mark one 1.__approve of plan  2. ___approve of plan with the following conditions.   ______________________________                                                          _____________________ Physician Signature                                                                                                             Date

## 2012-03-18 ENCOUNTER — Ambulatory Visit (HOSPITAL_COMMUNITY)
Admission: RE | Admit: 2012-03-18 | Discharge: 2012-03-18 | Disposition: A | Discharge status: Home / Self Care | Payer: Medicaid Other | Source: Ambulatory Visit | Attending: Nurse Practitioner | Admitting: Nurse Practitioner

## 2012-03-18 NOTE — Progress Notes (Addendum)
Physical Therapy Treatment Patient Details  Name: [name] MRN: [id number] Date of Birth: 08-May-1970  Today's Date: 03/18/2012 Time: 1515-1550 PT Time Calculation (min): 35 min  Visit#: 2  of 4   Charges: Therex x 35'  Authorization: medicaid  Authorization Visit#: 1  of 3    Subjective: Symptoms/Limitations Symptoms: Pt reports HEP compliance. Pain Assessment Currently in Pain?: Yes Pain Score:   5 Pain Location: Back Pain Orientation: Right;Left;Lower   Exercise/Treatments Stretches Active Hamstring Stretch: 3 reps;30 seconds;Limitations Active Hamstring Stretch Limitations: with rope Single Knee to Chest Stretch: 3 reps;30 seconds Supine Ab Set: 10 reps;5 seconds Clam: 10 reps Bent Knee Raise: 10 reps Bridge: 10 reps;5 seconds Isometric Hip Flexion: 10 reps Sidelying Hip Abduction: 10 reps  Physical Therapy Assessment and Plan PT Assessment and Plan Clinical Impression Statement: Pt completes therex well after initial cueing and demo. Pt displays good core contraction with stabilization exercises. Pt displays tightness in B hips an hamstrings.  PT Plan:  Continue per PT POC. Begin SLR, prone heel squeeze, SLR and opposite arm/leg raise.    Problem List Patient Active Problem List  Diagnosis  . Difficulty in walking  . Bilateral leg weakness    PT - End of Session Activity Tolerance: Patient tolerated treatment well General Behavior During Session: [facility] for tasks performed Cognition: [facility] for tasks performed  Seth Bake, PTA 03/18/2012, 4:56 PM

## 2012-03-25 ENCOUNTER — Ambulatory Visit (HOSPITAL_COMMUNITY)
Admission: RE | Admit: 2012-03-25 | Discharge: 2012-03-25 | Disposition: A | Discharge status: Home / Self Care | Payer: Medicaid Other | Source: Ambulatory Visit | Attending: Nurse Practitioner | Admitting: Nurse Practitioner

## 2012-03-25 DIAGNOSIS — R29898 Other symptoms and signs involving the musculoskeletal system: Secondary | ICD-10-CM

## 2012-03-25 DIAGNOSIS — R262 Difficulty in walking, not elsewhere classified: Secondary | ICD-10-CM

## 2012-03-25 NOTE — Progress Notes (Signed)
Physical Therapy Treatment Patient Details  Name: [name] MRN: [id number] Date of Birth: 09/01/1970  Today's Date: 03/25/2012 Time: 7829-5621 PT Time Calculation (min): 46 min  Visit#: 3  of 4   Re-eval: 04/01/12    Authorization: medicaid  Authorization Visit#: 2  of 3    Subjective: Symptoms/Limitations Symptoms: Pt pain is at a 4 or 5.  Doing exercises   Exercise/Treatments Stretches Active Hamstring Stretch: 2 reps;30 seconds Passive Hamstring Stretch: 1 rep;60 seconds Aerobic Tread Mill: 1.3 mph x 6:00   Standing Scapular Retraction: Strengthening;Theraband Theraband Level (Scapular Retraction): Level 3 (Green) Row: Strengthening;Theraband Theraband Level (Row): Level 3 (Green) Shoulder Extension: Theraband Theraband Level (Shoulder Extension): Level 3 (Green)   Supine Ab Set: 10 reps Bent Knee Raise: 10 reps Bridge: 10 reps Other Supine Lumbar Exercises: Ab/Adduction x 10   Prone  Straight Leg Raise: 10 reps Opposite Arm/Leg Raise: 10 reps Other Prone Lumbar Exercises: heel squeeze x 10   Manual Therapy Manual Therapy: Joint mobilization Joint Mobilization: for SI  Physical Therapy Assessment and Plan PT Assessment and Plan Clinical Impression Statement: Pt needs verbal cuing to stabilize properly while standing,(keeps shoulders behind hips); vc required for new prone ex as well.  Pt had noted SI dysfunction which was properly mobilized with good results.  Pain level remained the same after treatment PT Plan: assess SI; begin SLR and plank next treatment.  Next treatment last treatment due to insurance limitation    Goals    Problem List Patient Active Problem List  Diagnosis  . Difficulty in walking  . Bilateral leg weakness    PT - End of Session Activity Tolerance: Patient tolerated treatment well General Behavior During Session: [facility] for tasks performed Cognition: [facility] for tasks performed  GP    Dovid Bartko,CINDY 03/25/2012, 4:36  PM

## 2012-04-01 ENCOUNTER — Ambulatory Visit (HOSPITAL_COMMUNITY): Payer: Medicaid Other | Admitting: *Deleted

## 2012-04-08 ENCOUNTER — Ambulatory Visit (HOSPITAL_COMMUNITY): Payer: Medicaid Other | Admitting: *Deleted

## 2012-06-17 ENCOUNTER — Inpatient Hospital Stay (HOSPITAL_COMMUNITY): Admission: RE | Admit: 2012-06-17 | Payer: Medicaid Other | Source: Ambulatory Visit | Admitting: Physical Therapy

## 2012-08-19 ENCOUNTER — Other Ambulatory Visit: Payer: Self-pay | Admitting: Obstetrics & Gynecology

## 2013-02-25 ENCOUNTER — Other Ambulatory Visit (HOSPITAL_COMMUNITY): Payer: Self-pay | Admitting: Internal Medicine

## 2013-02-25 DIAGNOSIS — Z139 Encounter for screening, unspecified: Secondary | ICD-10-CM

## 2013-03-09 ENCOUNTER — Ambulatory Visit (HOSPITAL_COMMUNITY)
Admission: RE | Admit: 2013-03-09 | Discharge: 2013-03-09 | Disposition: A | Discharge status: Home / Self Care | Payer: Medicaid Other | Source: Ambulatory Visit | Attending: Internal Medicine | Admitting: Internal Medicine

## 2013-03-09 DIAGNOSIS — Z1231 Encounter for screening mammogram for malignant neoplasm of breast: Secondary | ICD-10-CM | POA: Insufficient documentation

## 2013-03-09 DIAGNOSIS — Z139 Encounter for screening, unspecified: Secondary | ICD-10-CM

## 2013-03-19 ENCOUNTER — Other Ambulatory Visit: Payer: Self-pay | Admitting: Obstetrics & Gynecology

## 2013-08-06 ENCOUNTER — Other Ambulatory Visit: Payer: Self-pay | Admitting: Obstetrics & Gynecology

## 2014-07-14 ENCOUNTER — Other Ambulatory Visit (HOSPITAL_COMMUNITY): Payer: Self-pay | Admitting: Respiratory Therapy

## 2014-07-14 DIAGNOSIS — R7309 Other abnormal glucose: Secondary | ICD-10-CM | POA: Diagnosis not present

## 2014-07-14 DIAGNOSIS — I10 Essential (primary) hypertension: Secondary | ICD-10-CM | POA: Diagnosis not present

## 2014-07-14 DIAGNOSIS — J449 Chronic obstructive pulmonary disease, unspecified: Secondary | ICD-10-CM

## 2014-07-14 DIAGNOSIS — M549 Dorsalgia, unspecified: Secondary | ICD-10-CM | POA: Diagnosis not present

## 2014-07-14 DIAGNOSIS — E784 Other hyperlipidemia: Secondary | ICD-10-CM | POA: Diagnosis not present

## 2014-08-02 ENCOUNTER — Encounter (HOSPITAL_COMMUNITY): Payer: Self-pay | Admitting: Cardiology

## 2014-08-02 ENCOUNTER — Emergency Department (HOSPITAL_COMMUNITY)
Admission: EM | Admit: 2014-08-02 | Discharge: 2014-08-02 | Disposition: A | Discharge status: Home / Self Care | Payer: Medicare Other | Attending: Emergency Medicine | Admitting: Emergency Medicine

## 2014-08-02 DIAGNOSIS — Z8639 Personal history of other endocrine, nutritional and metabolic disease: Secondary | ICD-10-CM | POA: Diagnosis not present

## 2014-08-02 DIAGNOSIS — I10 Essential (primary) hypertension: Secondary | ICD-10-CM | POA: Insufficient documentation

## 2014-08-02 DIAGNOSIS — Y9389 Activity, other specified: Secondary | ICD-10-CM | POA: Insufficient documentation

## 2014-08-02 DIAGNOSIS — Y9289 Other specified places as the place of occurrence of the external cause: Secondary | ICD-10-CM | POA: Diagnosis not present

## 2014-08-02 DIAGNOSIS — J45909 Unspecified asthma, uncomplicated: Secondary | ICD-10-CM | POA: Diagnosis not present

## 2014-08-02 DIAGNOSIS — S39012A Strain of muscle, fascia and tendon of lower back, initial encounter: Secondary | ICD-10-CM | POA: Diagnosis not present

## 2014-08-02 DIAGNOSIS — Y998 Other external cause status: Secondary | ICD-10-CM | POA: Diagnosis not present

## 2014-08-02 DIAGNOSIS — Z720 Tobacco use: Secondary | ICD-10-CM | POA: Diagnosis not present

## 2014-08-02 DIAGNOSIS — Z79899 Other long term (current) drug therapy: Secondary | ICD-10-CM | POA: Diagnosis not present

## 2014-08-02 DIAGNOSIS — S3992XA Unspecified injury of lower back, initial encounter: Secondary | ICD-10-CM | POA: Diagnosis present

## 2014-08-02 DIAGNOSIS — W1839XA Other fall on same level, initial encounter: Secondary | ICD-10-CM | POA: Diagnosis not present

## 2014-08-02 MED ORDER — CYCLOBENZAPRINE HCL 10 MG PO TABS: 10.0000 mg | ORAL_TABLET | Freq: Three times a day (TID) | ORAL | Status: DC | PRN

## 2014-08-02 MED ORDER — HYDROCODONE-ACETAMINOPHEN 5-325 MG PO TABS: ORAL_TABLET | ORAL | Status: DC

## 2014-08-02 NOTE — ED Notes (Signed)
Fall 2 days ago  C/o lower back pain.

## 2014-08-02 NOTE — Discharge Instructions (Signed)

## 2014-08-05 ENCOUNTER — Ambulatory Visit (HOSPITAL_COMMUNITY)
Admission: RE | Admit: 2014-08-05 | Discharge: 2014-08-05 | Disposition: A | Discharge status: Home / Self Care | Payer: Medicare Other | Source: Ambulatory Visit | Attending: Internal Medicine | Admitting: Internal Medicine

## 2014-08-05 ENCOUNTER — Other Ambulatory Visit (HOSPITAL_COMMUNITY): Payer: Self-pay | Admitting: Internal Medicine

## 2014-08-05 ENCOUNTER — Ambulatory Visit (HOSPITAL_COMMUNITY): Admission: RE | Admit: 2014-08-05 | Payer: Medicare Other | Source: Ambulatory Visit

## 2014-08-05 DIAGNOSIS — R062 Wheezing: Secondary | ICD-10-CM | POA: Diagnosis not present

## 2014-08-05 DIAGNOSIS — R05 Cough: Secondary | ICD-10-CM | POA: Insufficient documentation

## 2014-08-05 DIAGNOSIS — J40 Bronchitis, not specified as acute or chronic: Secondary | ICD-10-CM

## 2014-08-05 NOTE — ED Provider Notes (Signed)
CSN: [id number]     Arrival date & time 08/02/14  1802 History   First MD Initiated Contact with Patient 08/02/14 1820     Chief Complaint  Patient presents with  . Back Pain     (Consider location/radiation/quality/duration/timing/severity/associated sxs/prior Treatment) HPI  [name] is a 44 y.o. female who presents to the Emergency Department complaining of diffuse lower back pain for 2 days.  She states she suffered a mechanical fall, landing on her back.  Pain is worse with movement and improves with rest.  She has been taking tylenol without relief.  She denies fever, abdominal pain, numbness or weakness of the lower extremities, urine or bowel changes.  No radiation of pain to her legs.     Past Medical History  Diagnosis Date  . Asthma   . Headache(784.0)   . Hypertension     takes Triamertene and Aldomet daily  . Hyperlipidemia     not on medication yet d/t MD not calling it in yet  . Back pain   . Nocturia   . Brain aneurysm     at [redacted]wks pregnant   Past Surgical History  Procedure Laterality Date  . Intracranial aneurysm repair  06-16-2010  . Cesarean section  2012  . Laparoscopy  09/26/2010    Procedure: LAPAROSCOPY OPERATIVE;  Surgeon: Lazaro ArmsLuther H Eure, MD;  Location: [facility];  Service: Gynecology;  Laterality: N/A;  Laparoscopic Bilateral Salpingectomy  . Tonsillectomy      as a child  . Multiple extractions with alveoloplasty  05/13/2011    Procedure: MULTIPLE EXTRACION WITH ALVEOLOPLASTY;  Surgeon: Georgia LopesScott M Jensen, DDS;  Location: [facility];  Service: Oral Surgery;  Laterality: Bilateral;  extraction teeth #'s 2,3,4,5,17,18,30,31, and 32 with alveoloplasty.   Family History  Problem Relation Age of Onset  . Anesthesia problems Neg Hx   . Hypotension Neg Hx   . Malignant hyperthermia Neg Hx   . Pseudochol deficiency Neg Hx   . Hypertension Father    History  Substance Use Topics  . Smoking status: Current Every Day Smoker -- 1.00 packs/day for 15 years   Types: Cigarettes  . Smokeless tobacco: Not on file  . Alcohol Use: No   OB History    No data available     Review of Systems  Constitutional: Negative for fever.  Respiratory: Negative for shortness of breath.   Gastrointestinal: Negative for vomiting, abdominal pain and constipation.  Genitourinary: Negative for dysuria, hematuria, flank pain, decreased urine volume and difficulty urinating.  Musculoskeletal: Positive for back pain. Negative for joint swelling.  Skin: Negative for rash.  Neurological: Negative for weakness and numbness.  All other systems reviewed and are negative.     Allergies  Review of patient's allergies indicates no known allergies.  Home Medications   Prior to Admission medications   Medication Sig Start Date End Date Taking? Authorizing Provider  acetaminophen (TYLENOL) 500 MG tablet Take 1,000 mg by mouth every 4 (four) hours as needed. For pain 09/25/10  Yes Historical Provider, MD  lisinopril-hydrochlorothiazide (PRINZIDE,ZESTORETIC) 10-12.5 MG per tablet Take 1 tablet by mouth daily. 07/15/14  Yes Historical Provider, MD  PROAIR HFA 108 (90 BASE) MCG/ACT inhaler use as directed Patient taking differently: INHALE ONE TO TWO PUFFS EVERY 6 HOURS AS NEEDED FOR SHORTNESS OF BREATH AND/OR WHEEZING 08/06/13  Yes Lazaro ArmsLuther H Eure, MD  SPIRIVA HANDIHALER 18 MCG inhalation capsule Place 18 mcg into inhaler and inhale daily.  07/14/14  Yes Historical Provider, MD  TOVIAZ 4 MG TB24 tablet Take 4 mg by mouth daily. 07/15/14  Yes Historical Provider, MD  traMADol (ULTRAM) 50 MG tablet Take 50-100 mg by mouth every 6 (six) hours as needed. For pain   Yes Historical Provider, MD  cyclobenzaprine (FLEXERIL) 10 MG tablet Take 1 tablet (10 mg total) by mouth 3 (three) times daily as needed. 08/02/14   Sydnee Lamour, PA-C  HYDROcodone-acetaminophen (NORCO/VICODIN) 5-325 MG per tablet Take one-two tabs po q 4-6 hrs prn pain 08/02/14   Kissie Ziolkowski, PA-C   BP 145/84 mmHg  Pulse  101  Temp(Src) 97.9 F (36.6 C) (Oral)  Resp 18  Ht  (1.651 m)  Wt 180 lb (81.647 kg)  BMI 29.95 kg/m2  SpO2 100%  LMP 07/19/2014 Physical Exam  Constitutional: She is oriented to person, place, and time. She appears well-developed and well-nourished. No distress.  HENT:  Head: Normocephalic and atraumatic.  Neck: Normal range of motion. Neck supple.  Cardiovascular: Normal rate, regular rhythm, normal heart sounds and intact distal pulses.   No murmur heard. Pulmonary/Chest: Effort normal and breath sounds normal. No respiratory distress.  Abdominal: Soft. She exhibits no distension. There is no tenderness.  Musculoskeletal: She exhibits tenderness. She exhibits no edema.       Lumbar back: She exhibits tenderness and pain. She exhibits normal range of motion, no swelling, no deformity, no laceration and normal pulse.  ttp of the bilateral lumbar paraspinal muscles.  No spinal tenderness.  DP pulses are brisk and symmetrical.  Distal sensation intact.  Hip Flexors/Extensors are intact.  Pt has 5/5 strength against resistance of bilateral lower extremities.     Neurological: She is alert and oriented to person, place, and time. She has normal strength. No sensory deficit. She exhibits normal muscle tone. Coordination and gait normal.  Reflex Scores:      Patellar reflexes are 2+ on the right side and 2+ on the left side.      Achilles reflexes are 2+ on the right side and 2+ on the left side. Skin: Skin is warm and dry. No rash noted.  Nursing note and vitals reviewed.   ED Course  Procedures (including critical care time) Labs Review Labs Reviewed - No data to display  Imaging Review No results found.   EKG Interpretation None      MDM   Final diagnoses:  Lumbar strain, initial encounter    Pt ambulatory,  No focal neuro deficits. Sx's likely musculoskeletal.  Imaging not indicated at this time.  No concerning sx's for emergent neurological or infectious  process.  Pt agrees to arrange close PMD f/u if needed.      Becky Aus, PA-C 08/05/14 0050  Zadie Rhine, MD 08/05/14 [phone]

## 2014-08-08 LAB — PULMONARY FUNCTION TEST
DL/VA % pred: 99 %
DL/VA: 4.92 ml/min/mmHg/L
DLCO UNC % PRED: 88 %
DLCO unc: 22.73 ml/min/mmHg
FEF 25-75 PRE: 3.42 L/s
FEF2575-%Pred-Pre: 109 %
FEV1-%Pred-Pre: 101 %
FEV1-Pre: 3.13 L
FEV1FVC-%PRED-PRE: 103 %
FEV6-%Pred-Pre: 99 %
FEV6-PRE: 3.7 L
FEV6FVC-%Pred-Pre: 102 %
FVC-%Pred-Pre: 97 %
FVC-Pre: 3.7 L
PRE FEV1/FVC RATIO: 85 %
Pre FEV6/FVC Ratio: 100 %
RV % PRED: 114 %
RV: 1.93 L
TLC % pred: 102 %
TLC: 5.33 L

## 2015-01-17 DIAGNOSIS — J441 Chronic obstructive pulmonary disease with (acute) exacerbation: Secondary | ICD-10-CM | POA: Diagnosis not present

## 2015-01-17 DIAGNOSIS — I10 Essential (primary) hypertension: Secondary | ICD-10-CM | POA: Diagnosis not present

## 2015-02-02 ENCOUNTER — Encounter (HOSPITAL_COMMUNITY): Payer: Self-pay

## 2015-02-02 ENCOUNTER — Inpatient Hospital Stay (HOSPITAL_COMMUNITY)
Admission: EM | Admit: 2015-02-02 | Discharge: 2015-02-05 | DRG: 190 | Disposition: A | Discharge status: Home / Self Care | Payer: Medicare Other | Attending: Internal Medicine | Admitting: Internal Medicine

## 2015-02-02 ENCOUNTER — Emergency Department (HOSPITAL_COMMUNITY): Payer: Medicare Other

## 2015-02-02 DIAGNOSIS — R0602 Shortness of breath: Secondary | ICD-10-CM

## 2015-02-02 DIAGNOSIS — F17210 Nicotine dependence, cigarettes, uncomplicated: Secondary | ICD-10-CM | POA: Diagnosis present

## 2015-02-02 DIAGNOSIS — J189 Pneumonia, unspecified organism: Secondary | ICD-10-CM | POA: Diagnosis present

## 2015-02-02 DIAGNOSIS — R0600 Dyspnea, unspecified: Secondary | ICD-10-CM

## 2015-02-02 DIAGNOSIS — J440 Chronic obstructive pulmonary disease with acute lower respiratory infection: Principal | ICD-10-CM | POA: Diagnosis present

## 2015-02-02 DIAGNOSIS — R351 Nocturia: Secondary | ICD-10-CM | POA: Diagnosis present

## 2015-02-02 DIAGNOSIS — R05 Cough: Secondary | ICD-10-CM | POA: Diagnosis not present

## 2015-02-02 DIAGNOSIS — N3289 Other specified disorders of bladder: Secondary | ICD-10-CM | POA: Diagnosis not present

## 2015-02-02 DIAGNOSIS — Z8249 Family history of ischemic heart disease and other diseases of the circulatory system: Secondary | ICD-10-CM

## 2015-02-02 DIAGNOSIS — J45909 Unspecified asthma, uncomplicated: Secondary | ICD-10-CM | POA: Diagnosis present

## 2015-02-02 DIAGNOSIS — I10 Essential (primary) hypertension: Secondary | ICD-10-CM

## 2015-02-02 DIAGNOSIS — F17200 Nicotine dependence, unspecified, uncomplicated: Secondary | ICD-10-CM

## 2015-02-02 DIAGNOSIS — R0603 Acute respiratory distress: Secondary | ICD-10-CM

## 2015-02-02 LAB — CBC WITH DIFFERENTIAL/PLATELET
BAND NEUTROPHILS: 0 %
BASOS ABS: 0.1 10*3/uL (ref 0.0–0.1)
BLASTS: 0 %
Basophils Relative: 1 %
Eosinophils Absolute: 0.7 10*3/uL (ref 0.0–0.7)
Eosinophils Relative: 6 %
HEMATOCRIT: 42.2 % (ref 36.0–46.0)
HEMOGLOBIN: 14.1 g/dL (ref 12.0–15.0)
LYMPHS ABS: 2.8 10*3/uL (ref 0.7–4.0)
Lymphocytes Relative: 23 %
MCH: 28.1 pg (ref 26.0–34.0)
MCHC: 33.4 g/dL (ref 30.0–36.0)
MCV: 84.1 fL (ref 78.0–100.0)
METAMYELOCYTES PCT: 0 %
MYELOCYTES: 0 %
Monocytes Absolute: 0.6 10*3/uL (ref 0.1–1.0)
Monocytes Relative: 5 %
Neutro Abs: 8 10*3/uL — ABNORMAL HIGH (ref 1.7–7.7)
Neutrophils Relative %: 65 %
PROMYELOCYTES ABS: 0 %
Platelets: 275 10*3/uL (ref 150–400)
RBC: 5.02 MIL/uL (ref 3.87–5.11)
RDW: 13.1 % (ref 11.5–15.5)
WBC: 12.2 10*3/uL — AB (ref 4.0–10.5)
nRBC: 0 /100 WBC

## 2015-02-02 LAB — BASIC METABOLIC PANEL
Anion gap: 12 (ref 5–15)
BUN: 8 mg/dL (ref 6–20)
CHLORIDE: 102 mmol/L (ref 101–111)
CO2: 25 mmol/L (ref 22–32)
Calcium: 9.4 mg/dL (ref 8.9–10.3)
Creatinine, Ser: 0.9 mg/dL (ref 0.44–1.00)
GFR calc Af Amer: >60 mL/min (ref >60)
GFR calc non Af Amer: >60 mL/min (ref >60)
Glucose, Bld: 117 mg/dL — ABNORMAL HIGH (ref 65–99)
POTASSIUM: 3.7 mmol/L (ref 3.5–5.1)
SODIUM: 139 mmol/L (ref 135–145)

## 2015-02-02 MED ORDER — DEXTROSE 5 % IV SOLN
1.0000 g | Freq: Once | INTRAVENOUS | Status: AC
  Administered 2015-02-02: 1 g via INTRAVENOUS
  Filled 2015-02-02: qty 10

## 2015-02-02 MED ORDER — IPRATROPIUM-ALBUTEROL 0.5-2.5 (3) MG/3ML IN SOLN
3.0000 mL | Freq: Once | RESPIRATORY_TRACT | Status: AC
  Administered 2015-02-02: 3 mL via RESPIRATORY_TRACT
  Filled 2015-02-02: qty 3

## 2015-02-02 MED ORDER — ALBUTEROL (5 MG/ML) CONTINUOUS INHALATION SOLN
10.0000 mg/h | INHALATION_SOLUTION | Freq: Once | RESPIRATORY_TRACT | Status: AC
  Administered 2015-02-02: 10 mg/h via RESPIRATORY_TRACT
  Filled 2015-02-02: qty 20

## 2015-02-02 MED ORDER — ONDANSETRON HCL 4 MG/2ML IJ SOLN: 4.0000 mg | Freq: Three times a day (TID) | INTRAMUSCULAR | Status: AC | PRN

## 2015-02-02 MED ORDER — ALBUTEROL SULFATE (2.5 MG/3ML) 0.083% IN NEBU
2.5000 mg | INHALATION_SOLUTION | Freq: Once | RESPIRATORY_TRACT | Status: AC
  Administered 2015-02-02: 2.5 mg via RESPIRATORY_TRACT
  Filled 2015-02-02: qty 3

## 2015-02-02 MED ORDER — METHYLPREDNISOLONE SODIUM SUCC 125 MG IJ SOLR
125.0000 mg | Freq: Once | INTRAMUSCULAR | Status: AC
  Administered 2015-02-02: 125 mg via INTRAVENOUS
  Filled 2015-02-02: qty 2

## 2015-02-02 MED ORDER — IPRATROPIUM BROMIDE 0.02 % IN SOLN
0.5000 mg | Freq: Once | RESPIRATORY_TRACT | Status: AC
  Administered 2015-02-02: 0.5 mg via RESPIRATORY_TRACT
  Filled 2015-02-02: qty 2.5

## 2015-02-02 NOTE — H&P (Signed)
[facility] History and Physical  [name] ZOX:[id number] DOB: December 19, 1970 DOA: 02/02/2015  Referring physician: Dr Manus Gunning - [facility] PCP: Avon Gully, MD   Chief Complaint: SOB and cough  HPI: [name] is a 44 y.o. female  3 days of worsening cough and shortness of breath. Initially symptoms are intermittent other constant. Getting worse. Nothing makes symptoms better. Sx worse w/ exertion. Audible wheezing.. Continues to smoke. Ran out of albuterol 4 days ago. Patient states that she had similar symptoms starting on 01/17/2015 and was started on a Z-Pak with significant improvement in her symptoms. However after stopping the Z-Pak symptoms slowly began to come back.  Review of Systems:  Constitutional:  No weight loss, night sweats, Fevers, chills,  HEENT:  No headaches, Difficulty swallowing,Tooth/dental problems,Sore throat, Cardio-vascular:  No chest pain, Orthopnea, PND, swelling in lower extremities, anasarca, dizziness, palpitations  GI:  No heartburn, indigestion, abdominal pain, nausea, vomiting, diarrhea, change in bowel habits, loss of appetite  Resp: Per HPI Skin:  no rash or lesions.  GU:  no dysuria, change in color of urine, no urgency or frequency. No flank pain.  Musculoskeletal:   No joint pain or swelling. No decreased range of motion. No back pain.  Psych:  No change in mood or affect. No depression or anxiety. No memory loss.  Neuro:  No change in sensation, unilateral strength, or cognitive abilities  All other systems were reviewed and are negative.  Past Medical History  Diagnosis Date  . Asthma   . Headache(784.0)   . Hypertension     takes Triamertene and Aldomet daily  . Hyperlipidemia     not on medication yet d/t MD not calling it in yet  . Back pain   . Nocturia   . Brain aneurysm     at [redacted]wks pregnant   Past Surgical History  Procedure Laterality Date  . Intracranial aneurysm repair  06-16-2010  . Cesarean section   2012  . Laparoscopy  09/26/2010    Procedure: LAPAROSCOPY OPERATIVE;  Surgeon: Lazaro Arms, MD;  Location: [facility];  Service: Gynecology;  Laterality: N/A;  Laparoscopic Bilateral Salpingectomy  . Tonsillectomy      as a child  . Multiple extractions with alveoloplasty  05/13/2011    Procedure: MULTIPLE EXTRACION WITH ALVEOLOPLASTY;  Surgeon: Georgia Lopes, DDS;  Location: [facility];  Service: Oral Surgery;  Laterality: Bilateral;  extraction teeth #'s 2,3,4,5,17,18,30,31, and 32 with alveoloplasty.   Social History:  reports that she has been smoking Cigarettes.  She has a 15 pack-year smoking history. She does not have any smokeless tobacco history on file. She reports that she uses illicit drugs (Marijuana) about once per week. She reports that she does not drink alcohol.  No Known Allergies  Family History  Problem Relation Age of Onset  . Anesthesia problems Neg Hx   . Hypotension Neg Hx   . Malignant hyperthermia Neg Hx   . Pseudochol deficiency Neg Hx   . Hypertension Father      Prior to Admission medications   Medication Sig Start Date End Date Taking? Authorizing Provider  lisinopril-hydrochlorothiazide (PRINZIDE,ZESTORETIC) 20-12.5 MG tablet Take 1 tablet by mouth daily.   Yes Historical Provider, MD  oxybutynin (DITROPAN-XL) 5 MG 24 hr tablet Take 5 mg by mouth daily. 01/17/15  Yes Historical Provider, MD  SPIRIVA HANDIHALER 18 MCG inhalation capsule Place 18 mcg into inhaler and inhale daily.  07/14/14  Yes Historical Provider, MD  traMADol (ULTRAM) 50 MG tablet  Take 50-100 mg by mouth every 6 (six) hours as needed. For pain   Yes Historical Provider, MD  acetaminophen (TYLENOL) 500 MG tablet Take 1,000 mg by mouth every 4 (four) hours as needed. For pain 09/25/10   Historical Provider, MD  cyclobenzaprine (FLEXERIL) 10 MG tablet Take 1 tablet (10 mg total) by mouth 3 (three) times daily as needed. Patient not taking: Reported on 02/02/2015 08/02/14   Pauline Aus, PA-C    HYDROcodone-acetaminophen (NORCO/VICODIN) 5-325 MG per tablet Take one-two tabs po q 4-6 hrs prn pain Patient not taking: Reported on 02/02/2015 08/02/14   Tammy Triplett, PA-C  PROAIR HFA 108 (90 BASE) MCG/ACT inhaler use as directed Patient taking differently: INHALE ONE TO TWO PUFFS EVERY 6 HOURS AS NEEDED FOR SHORTNESS OF BREATH AND/OR WHEEZING 08/06/13   Lazaro Arms, MD   Physical Exam: Filed Vitals:   02/02/15 2007 02/02/15 2100 02/02/15 2130 02/02/15 2225  BP: 123/77 111/70 115/70 136/83  Pulse: 107 94 103 101  Temp:    98.3 F (36.8 C)  TempSrc:    Oral  Resp: 20   20  Height:     (1.651 m)  Weight:    81.239 kg (179 lb 1.6 oz)  SpO2: 92% 90% 94% 95%    Wt Readings from Last 3 Encounters:  02/02/15 81.239 kg (179 lb 1.6 oz)  08/02/14 81.647 kg (180 lb)  07/10/11 84.369 kg (186 lb)    General:  Appears calm and comfortable Eyes:  PERRL, EOMI, normal lids, iris ENT:  grossly normal hearing, lips & tongue Neck:  no LAD, masses or thyromegaly Cardiovascular:  RRR, no m/r/g. No LE edema.  Respiratory: Diffuse wheezing and poor aeration. Diminished in bases Abdomen:  soft, ntnd Skin:  no rash or induration seen on limited exam Musculoskeletal:  grossly normal tone BUE/BLE Psychiatric:  grossly normal mood and affect, speech fluent and appropriate Neurologic:  CN 2-12 grossly intact, moves all extremities in coordinated fashion.          Labs on Admission:  Basic Metabolic Panel:  Recent Labs Lab 02/02/15 1618  NA 139  K 3.7  CL 102  CO2 25  GLUCOSE 117*  BUN 8  CREATININE 0.90  CALCIUM 9.4   Liver Function Tests: No results for input(s): AST, ALT, ALKPHOS, BILITOT, PROT, ALBUMIN in the last 168 hours. No results for input(s): LIPASE, AMYLASE in the last 168 hours. No results for input(s): AMMONIA in the last 168 hours. CBC:  Recent Labs Lab 02/02/15 1618  WBC 12.2*  NEUTROABS 8.0*  HGB 14.1  HCT 42.2  MCV 84.1  PLT 275   Cardiac  Enzymes: No results for input(s): CKTOTAL, CKMB, CKMBINDEX, TROPONINI in the last 168 hours.  BNP (last 3 results) No results for input(s): BNP in the last 8760 hours.  ProBNP (last 3 results) No results for input(s): PROBNP in the last 8760 hours.   CREATININE: 0.9 (02/02/15 1618) Estimated creatinine clearance - 85.7 mL/min  CBG: No results for input(s): GLUCAP in the last 168 hours.  Radiological Exams on Admission: Dg Chest 2 View  02/02/2015  CLINICAL DATA:  Patient is shortness of breath and cough. Worsening over multiple days. EXAM: CHEST  2 VIEW COMPARISON:  Chest radiograph 08/05/2014. FINDINGS: Multiple monitoring leads overlie the patient. Stable cardiac and mediastinal contours. Interval development of multiple patchy bilateral areas of consolidation. Visualized osseous skeleton is unremarkable. IMPRESSION: Patchy heterogeneous opacities within the lungs bilaterally are nonspecific however may represent multi focal  infection in the appropriate clinical setting. Followup PA and lateral chest X-ray is recommended in 3-4 weeks following trial of antibiotic therapy to ensure resolution and exclude underlying malignancy. Electronically Signed   By: Annia Beltrew  Davis M.D.   On: 02/02/2015 16:42      Assessment/Plan Active Problems:   Pneumonia   Acute respiratory distress (HCC)   Tobacco use disorder   Essential hypertension   Bladder spasm   Acute respiratory distress: likely secondary to combination of Asthma attack and CAP. New O2 requirement - Obs - O2 - sputum Cx - Legionella, strep Ag - CTX and Azithro - duonebs - Solumedrol 60 Q6  HTN: - continue lisinopril and HCTZ  Bladder spasm/nocturia: - continue oxybutinin  Tobacco: 1ppd - Niocotine patch  Code Status: FULL  DVT Prophylaxis: Hep Family Communication: None Disposition Plan: Pending Improvement    MERRELL, DAVID Shela CommonsJ, MD Family Medicine [facility] [email or web address] Password [email or web address]

## 2015-02-02 NOTE — ED Notes (Signed)
Pt reports had cough/congestion and was put on zpack and another antibiotic and prednisone around nov 9th.  PT says got better but started having cough, sob, and congestion again 3 days ago.  Pt c/o pain in ribs and abd with coughing.

## 2015-02-02 NOTE — ED Notes (Signed)
Patient ambulated around nurses station. O2 sat reading 88 to 92 percent. Patient started coughing and stated that she felt short winded. Upon returning to room and resting a few minutes O2 sat reading 92-94 percent.

## 2015-02-02 NOTE — ED Notes (Addendum)
IV cannula flushed with NS post admin of ABT's. Dr Manus Gunningancour into room to see pt

## 2015-02-02 NOTE — ED Notes (Signed)
Went to patient's room to ambulate.  Patient being set up for hour long neb - patient will be ambulated after neb.

## 2015-02-02 NOTE — ED Provider Notes (Signed)
CSN: [id number]     Arrival date & time 02/02/15  1557 History   First MD Initiated Contact with Patient 02/02/15 1608     Chief Complaint  Patient presents with  . Cough     (Consider location/radiation/quality/duration/timing/severity/associated sxs/prior Treatment) The history is provided by the patient.   [name] is a 44 y.o. female with a past medical history of asthma and htn presenting with a 3 day history of nonproductive cough, congestion along with wheezing and shortness of breath which is worsened at night.  She endorses similar symptoms the beginning of the month and underwent a course of zithromax, another unknown antibiotic and a prednisone taper with improvement in her symptoms until now.  She described midsternal and chest wall pain triggered by coughing.  She denies nausea, vomiting, abdominal pain, peripheral edema, nasal or sinus congestion.  She has used her daughters nebulizer machine last night with transient improvement.  She lost her albuterol mdi. She is a smoker but not in the past several days.    Past Medical History  Diagnosis Date  . Asthma   . Headache(784.0)   . Hypertension     takes Triamertene and Aldomet daily  . Hyperlipidemia     not on medication yet d/t MD not calling it in yet  . Back pain   . Nocturia   . Brain aneurysm     at [redacted]wks pregnant   Past Surgical History  Procedure Laterality Date  . Intracranial aneurysm repair  06-16-2010  . Cesarean section  2012  . Laparoscopy  09/26/2010    Procedure: LAPAROSCOPY OPERATIVE;  Surgeon: Lazaro ArmsLuther H Eure, MD;  Location: [facility];  Service: Gynecology;  Laterality: N/A;  Laparoscopic Bilateral Salpingectomy  . Tonsillectomy      as a child  . Multiple extractions with alveoloplasty  05/13/2011    Procedure: MULTIPLE EXTRACION WITH ALVEOLOPLASTY;  Surgeon: Georgia LopesScott M Jensen, DDS;  Location: [facility];  Service: Oral Surgery;  Laterality: Bilateral;  extraction teeth #'s 2,3,4,5,17,18,30,31, and 32  with alveoloplasty.   Family History  Problem Relation Age of Onset  . Anesthesia problems Neg Hx   . Hypotension Neg Hx   . Malignant hyperthermia Neg Hx   . Pseudochol deficiency Neg Hx   . Hypertension Father    Social History  Substance Use Topics  . Smoking status: Current Every Day Smoker -- 1.00 packs/day for 15 years    Types: Cigarettes  . Smokeless tobacco: None  . Alcohol Use: No   OB History    No data available     Review of Systems  Constitutional: Negative for fever.  HENT: Negative for congestion and sore throat.   Eyes: Negative.   Respiratory: Positive for cough, shortness of breath and wheezing. Negative for chest tightness.   Cardiovascular: Negative for palpitations and leg swelling.  Gastrointestinal: Negative for nausea, vomiting and abdominal pain.  Genitourinary: Negative.   Musculoskeletal: Negative for joint swelling, arthralgias and neck pain.  Skin: Negative.  Negative for rash and wound.  Neurological: Negative for dizziness, weakness, light-headedness, numbness and headaches.  Psychiatric/Behavioral: Negative.       Allergies  Review of patient's allergies indicates no known allergies.  Home Medications   Prior to Admission medications   Medication Sig Start Date End Date Taking? Authorizing Provider  lisinopril-hydrochlorothiazide (PRINZIDE,ZESTORETIC) 20-12.5 MG tablet Take 1 tablet by mouth daily.   Yes Historical Provider, MD  oxybutynin (DITROPAN-XL) 5 MG 24 hr tablet Take 5 mg by mouth  daily. 01/17/15  Yes Historical Provider, MD  SPIRIVA HANDIHALER 18 MCG inhalation capsule Place 18 mcg into inhaler and inhale daily.  07/14/14  Yes Historical Provider, MD  traMADol (ULTRAM) 50 MG tablet Take 50-100 mg by mouth every 6 (six) hours as needed. For pain   Yes Historical Provider, MD  acetaminophen (TYLENOL) 500 MG tablet Take 1,000 mg by mouth every 4 (four) hours as needed. For pain 09/25/10   Historical Provider, MD  cyclobenzaprine  (FLEXERIL) 10 MG tablet Take 1 tablet (10 mg total) by mouth 3 (three) times daily as needed. Patient not taking: Reported on 02/02/2015 08/02/14   Tammy Triplett, PA-C  HYDROcodone-acetaminophen (NORCO/VICODIN) 5-325 MG per tablet Take one-two tabs po q 4-6 hrs prn pain Patient not taking: Reported on 02/02/2015 08/02/14   Tammy Triplett, PA-C  PROAIR HFA 108 (90 BASE) MCG/ACT inhaler use as directed Patient taking differently: INHALE ONE TO TWO PUFFS EVERY 6 HOURS AS NEEDED FOR SHORTNESS OF BREATH AND/OR WHEEZING 08/06/13   Lazaro Arms, MD   BP 123/77 mmHg  Pulse 107  Temp(Src) 98.5 F (36.9 C) (Oral)  Resp 20  Ht  (1.651 m)  Wt 81.647 kg  BMI 29.95 kg/m2  SpO2 92%  LMP 01/19/2015 Physical Exam  Constitutional: She appears well-developed and well-nourished.  HENT:  Head: Normocephalic and atraumatic.  Eyes: Conjunctivae are normal.  Neck: Normal range of motion.  Cardiovascular: Normal rate, regular rhythm, normal heart sounds and intact distal pulses.   Pulmonary/Chest: Effort normal. She has decreased breath sounds in the right lower field and the left lower field. She has wheezes. She has rhonchi.  Inspiratory and expiratory wheezes throughout all lung fields with reduced aeration at bases, prolonged respirations.  Bilateral rhonchi.  Abdominal: Soft. Bowel sounds are normal. There is no tenderness.  Musculoskeletal: Normal range of motion. She exhibits no edema.  Neurological: She is alert.  Skin: Skin is warm and dry.  Psychiatric: She has a normal mood and affect.  Nursing note and vitals reviewed.   ED Course  Procedures (including critical care time) Labs Review Labs Reviewed  CBC WITH DIFFERENTIAL/PLATELET - Abnormal; Notable for the following:    WBC 12.2 (*)    All other components within normal limits  BASIC METABOLIC PANEL - Abnormal; Notable for the following:    Glucose, Bld 117 (*)    All other components within normal limits    Imaging Review Dg  Chest 2 View  02/02/2015  CLINICAL DATA:  Patient is shortness of breath and cough. Worsening over multiple days. EXAM: CHEST  2 VIEW COMPARISON:  Chest radiograph 08/05/2014. FINDINGS: Multiple monitoring leads overlie the patient. Stable cardiac and mediastinal contours. Interval development of multiple patchy bilateral areas of consolidation. Visualized osseous skeleton is unremarkable. IMPRESSION: Patchy heterogeneous opacities within the lungs bilaterally are nonspecific however may represent multi focal infection in the appropriate clinical setting. Followup PA and lateral chest X-ray is recommended in 3-4 weeks following trial of antibiotic therapy to ensure resolution and exclude underlying malignancy. Electronically Signed   By: Annia Belt M.D.   On: 02/02/2015 16:42   I have personally reviewed and evaluated these images and lab results as part of my medical decision-making.   EKG Interpretation   Date/Time:  Thursday February 02 2015 16:22:29 EST Ventricular Rate:  87 PR Interval:  114 QRS Duration: 97 QT Interval:  362 QTC Calculation: 435 R Axis:   97 Text Interpretation:  Sinus rhythm Borderline short PR interval Probable  inferior infarct, old No significant change was found Confirmed by Manus Gunning   MD, STEPHEN [phone]) on 02/02/2015 4:26:14 PM      MDM   Final diagnoses:  Community acquired pneumonia  Shortness of breath    Patients labs reviewed.  Radiological studies were viewed, interpreted and considered during the medical decision making and disposition process. I agree with radiologists reading.  Results were also discussed with patient.   Medications  methylPREDNISolone sodium succinate (SOLU-MEDROL) 125 mg/2 mL injection 125 mg (125 mg Intravenous Given 02/02/15 1650)  ipratropium-albuterol (DUONEB) 0.5-2.5 (3) MG/3ML nebulizer solution 3 mL (3 mLs Nebulization Given 02/02/15 1657)  albuterol (PROVENTIL) (2.5 MG/3ML) 0.083% nebulizer solution 2.5 mg (2.5 mg  Nebulization Given 02/02/15 1657)  cefTRIAXone (ROCEPHIN) 1 g in dextrose 5 % 50 mL IVPB (0 g Intravenous Stopped 02/02/15 1746)  albuterol (PROVENTIL,VENTOLIN) solution continuous neb (10 mg/hr Nebulization Given 02/02/15 1756)  ipratropium (ATROVENT) nebulizer solution 0.5 mg (0.5 mg Nebulization Given 02/02/15 1756)   Pt given multiple neb tx, solumedrol, still with moderate wheezing, although pt reports easier breathing after second neb.  Ambulation in dept with pulse ox, became significantly sob, pulse ox to 86/88 %.  Will plan admission, call to Dr Konrad Dolores who agrees.  Temp admission orders placed.    Burgess Amor, PA-C 02/02/15 2120  Glynn Octave, MD 02/03/15 [phone]

## 2015-02-03 DIAGNOSIS — Z8249 Family history of ischemic heart disease and other diseases of the circulatory system: Secondary | ICD-10-CM | POA: Diagnosis not present

## 2015-02-03 DIAGNOSIS — J45909 Unspecified asthma, uncomplicated: Secondary | ICD-10-CM | POA: Diagnosis present

## 2015-02-03 DIAGNOSIS — I10 Essential (primary) hypertension: Secondary | ICD-10-CM

## 2015-02-03 DIAGNOSIS — F17210 Nicotine dependence, cigarettes, uncomplicated: Secondary | ICD-10-CM | POA: Diagnosis present

## 2015-02-03 DIAGNOSIS — R351 Nocturia: Secondary | ICD-10-CM | POA: Diagnosis present

## 2015-02-03 DIAGNOSIS — J441 Chronic obstructive pulmonary disease with (acute) exacerbation: Secondary | ICD-10-CM | POA: Diagnosis not present

## 2015-02-03 DIAGNOSIS — R0602 Shortness of breath: Secondary | ICD-10-CM | POA: Diagnosis present

## 2015-02-03 DIAGNOSIS — N3289 Other specified disorders of bladder: Secondary | ICD-10-CM

## 2015-02-03 DIAGNOSIS — J189 Pneumonia, unspecified organism: Secondary | ICD-10-CM | POA: Diagnosis not present

## 2015-02-03 DIAGNOSIS — F17200 Nicotine dependence, unspecified, uncomplicated: Secondary | ICD-10-CM

## 2015-02-03 DIAGNOSIS — J440 Chronic obstructive pulmonary disease with acute lower respiratory infection: Secondary | ICD-10-CM | POA: Diagnosis present

## 2015-02-03 DIAGNOSIS — R0603 Acute respiratory distress: Secondary | ICD-10-CM

## 2015-02-03 LAB — COMPREHENSIVE METABOLIC PANEL
ALBUMIN: 3.6 g/dL (ref 3.5–5.0)
ALK PHOS: 55 U/L (ref 38–126)
ALT: 13 U/L — ABNORMAL LOW (ref 14–54)
ANION GAP: 10 (ref 5–15)
AST: 18 U/L (ref 15–41)
BILIRUBIN TOTAL: 0.3 mg/dL (ref 0.3–1.2)
BUN: 8 mg/dL (ref 6–20)
CALCIUM: 9.7 mg/dL (ref 8.9–10.3)
CO2: 24 mmol/L (ref 22–32)
Chloride: 105 mmol/L (ref 101–111)
Creatinine, Ser: 0.76 mg/dL (ref 0.44–1.00)
GFR calc Af Amer: >60 mL/min (ref >60)
GLUCOSE: 167 mg/dL — AB (ref 65–99)
Potassium: 4.2 mmol/L (ref 3.5–5.1)
Sodium: 139 mmol/L (ref 135–145)
TOTAL PROTEIN: 7.8 g/dL (ref 6.5–8.1)

## 2015-02-03 LAB — CBC
HEMATOCRIT: 41.9 % (ref 36.0–46.0)
HEMOGLOBIN: 14.3 g/dL (ref 12.0–15.0)
MCH: 28.2 pg (ref 26.0–34.0)
MCHC: 34.1 g/dL (ref 30.0–36.0)
MCV: 82.6 fL (ref 78.0–100.0)
Platelets: 287 10*3/uL (ref 150–400)
RBC: 5.07 MIL/uL (ref 3.87–5.11)
RDW: 12.9 % (ref 11.5–15.5)
WBC: 10.2 10*3/uL (ref 4.0–10.5)

## 2015-02-03 MED ORDER — METHYLPREDNISOLONE SODIUM SUCC 125 MG IJ SOLR
60.0000 mg | Freq: Four times a day (QID) | INTRAMUSCULAR | Status: DC
  Administered 2015-02-04: 60 mg via INTRAVENOUS
  Filled 2015-02-03: qty 2

## 2015-02-03 MED ORDER — LISINOPRIL-HYDROCHLOROTHIAZIDE 20-12.5 MG PO TABS: 1.0000 | ORAL_TABLET | Freq: Every day | ORAL | Status: DC

## 2015-02-03 MED ORDER — ACETAMINOPHEN 325 MG PO TABS: 650.0000 mg | ORAL_TABLET | Freq: Four times a day (QID) | ORAL | Status: DC | PRN

## 2015-02-03 MED ORDER — NICOTINE 21 MG/24HR TD PT24
21.0000 mg | MEDICATED_PATCH | Freq: Every day | TRANSDERMAL | Status: DC
  Administered 2015-02-03 – 2015-02-05 (×3): 21 mg via TRANSDERMAL
  Filled 2015-02-03 (×3): qty 1

## 2015-02-03 MED ORDER — ACETAMINOPHEN 650 MG RE SUPP: 650.0000 mg | Freq: Four times a day (QID) | RECTAL | Status: DC | PRN

## 2015-02-03 MED ORDER — IPRATROPIUM-ALBUTEROL 0.5-2.5 (3) MG/3ML IN SOLN
3.0000 mL | RESPIRATORY_TRACT | Status: DC
  Administered 2015-02-03 – 2015-02-05 (×10): 3 mL via RESPIRATORY_TRACT
  Filled 2015-02-03 (×10): qty 3

## 2015-02-03 MED ORDER — ENOXAPARIN SODIUM 40 MG/0.4ML ~~LOC~~ SOLN
40.0000 mg | SUBCUTANEOUS | Status: DC
  Administered 2015-02-03 – 2015-02-05 (×3): 40 mg via SUBCUTANEOUS
  Filled 2015-02-03 (×3): qty 0.4

## 2015-02-03 MED ORDER — LISINOPRIL 10 MG PO TABS
20.0000 mg | ORAL_TABLET | Freq: Every day | ORAL | Status: DC
  Administered 2015-02-03 – 2015-02-05 (×3): 20 mg via ORAL
  Filled 2015-02-03 (×3): qty 2

## 2015-02-03 MED ORDER — CETYLPYRIDINIUM CHLORIDE 0.05 % MT LIQD
7.0000 mL | Freq: Two times a day (BID) | OROMUCOSAL | Status: DC
  Administered 2015-02-03 – 2015-02-05 (×5): 7 mL via OROMUCOSAL

## 2015-02-03 MED ORDER — IPRATROPIUM-ALBUTEROL 0.5-2.5 (3) MG/3ML IN SOLN
3.0000 mL | RESPIRATORY_TRACT | Status: DC
  Administered 2015-02-03: 3 mL via RESPIRATORY_TRACT
  Filled 2015-02-03: qty 3

## 2015-02-03 MED ORDER — TRAMADOL HCL 50 MG PO TABS
50.0000 mg | ORAL_TABLET | Freq: Four times a day (QID) | ORAL | Status: DC | PRN
  Administered 2015-02-03: 100 mg via ORAL
  Administered 2015-02-03: 50 mg via ORAL
  Administered 2015-02-04: 100 mg via ORAL
  Filled 2015-02-03: qty 2
  Filled 2015-02-03: qty 1
  Filled 2015-02-03: qty 2

## 2015-02-03 MED ORDER — SODIUM CHLORIDE 0.9 % IJ SOLN: 3.0000 mL | INTRAMUSCULAR | Status: DC | PRN

## 2015-02-03 MED ORDER — DEXTROSE 5 % IV SOLN
1.0000 g | INTRAVENOUS | Status: DC
  Administered 2015-02-03 – 2015-02-04 (×2): 1 g via INTRAVENOUS
  Filled 2015-02-03 (×3): qty 10

## 2015-02-03 MED ORDER — HYDROCHLOROTHIAZIDE 12.5 MG PO CAPS
12.5000 mg | ORAL_CAPSULE | Freq: Every day | ORAL | Status: DC
  Administered 2015-02-03 – 2015-02-05 (×3): 12.5 mg via ORAL
  Filled 2015-02-03 (×3): qty 1

## 2015-02-03 MED ORDER — SODIUM CHLORIDE 0.9 % IJ SOLN
3.0000 mL | Freq: Two times a day (BID) | INTRAMUSCULAR | Status: DC
  Administered 2015-02-03 – 2015-02-05 (×5): 3 mL via INTRAVENOUS

## 2015-02-03 MED ORDER — AZITHROMYCIN 250 MG PO TABS
500.0000 mg | ORAL_TABLET | ORAL | Status: DC
  Administered 2015-02-03 – 2015-02-05 (×3): 500 mg via ORAL
  Filled 2015-02-03 (×3): qty 2

## 2015-02-03 MED ORDER — TIOTROPIUM BROMIDE MONOHYDRATE 18 MCG IN CAPS
18.0000 ug | ORAL_CAPSULE | Freq: Every day | RESPIRATORY_TRACT | Status: DC
  Administered 2015-02-03 – 2015-02-05 (×3): 18 ug via RESPIRATORY_TRACT
  Filled 2015-02-03: qty 5

## 2015-02-03 MED ORDER — OXYBUTYNIN CHLORIDE ER 5 MG PO TB24
5.0000 mg | ORAL_TABLET | Freq: Every day | ORAL | Status: DC
  Administered 2015-02-03 – 2015-02-05 (×3): 5 mg via ORAL
  Filled 2015-02-03 (×3): qty 1

## 2015-02-03 MED ORDER — SODIUM CHLORIDE 0.9 % IV SOLN: 250.0000 mL | INTRAVENOUS | Status: DC | PRN

## 2015-02-03 MED ORDER — METHYLPREDNISOLONE SODIUM SUCC 125 MG IJ SOLR
60.0000 mg | Freq: Four times a day (QID) | INTRAMUSCULAR | Status: DC
  Administered 2015-02-03 (×4): 60 mg via INTRAVENOUS
  Filled 2015-02-03 (×4): qty 2

## 2015-02-03 NOTE — Care Management Note (Signed)
Case Management Note  Patient Details  Name: [name] MRN: [id number] Date of Birth: 1970-09-30  Subjective/Objective:                  Pt admitted from home with pneumonia. Pt lives with her mother and son and will return home at discharge. Pt is independent with ADL's. Pt has neb machine for home use.  Action/Plan: Pt will need home O2 assessment prior to discharge. No other CM needs noted. If pt qualifies bedside RN can arrange with agency of choice.  Expected Discharge Date:  02/04/15               Expected Discharge Plan:  Home/Self Care  In-House Referral:  NA  Discharge planning Services  CM Consult  Post Acute Care Choice:  NA Choice offered to:  NA  DME Arranged:    DME Agency:     HH Arranged:    HH Agency:     Status of Service:  Completed, signed off  Medicare Important Message Given:    Date Medicare IM Given:    Medicare IM give by:    Date Additional Medicare IM Given:    Additional Medicare Important Message give by:     If discussed at Long Length of Stay Meetings, dates discussed:    Additional Comments:  Cheryl FlashBlackwell, Blenda Wisecup Crowder, RN 02/03/2015, 11:01 AM

## 2015-02-03 NOTE — Progress Notes (Signed)
ANTIBIOTIC CONSULT NOTE  Pharmacy Consult for Renal adjustment if needed (ABX)  No Known Allergies  Patient Measurements: Height: 5\' 5"  (165.1 cm) Weight: 179 lb 1.6 oz (81.239 kg) IBW/kg (Calculated) : 57  Vital Signs: Temp: 98 F (36.7 C) (11/25 0437) Temp Source: Oral (11/25 0437) BP: 115/69 mmHg (11/25 0437) Pulse Rate: 97 (11/25 0437)  Labs:  Recent Labs  02/02/15 1618 02/03/15 0245  WBC 12.2* 10.2  HGB 14.1 14.3  PLT 275 287  CREATININE 0.90 0.76    Estimated Creatinine Clearance: 96.5 mL/min (by C-G formula based on Cr of 0.76).  No results for input(s): VANCOTROUGH, VANCOPEAK, VANCORANDOM, GENTTROUGH, GENTPEAK, GENTRANDOM, TOBRATROUGH, TOBRAPEAK, TOBRARND, AMIKACINPEAK, AMIKACINTROU, AMIKACIN in the last 72 hours.   Microbiology: Recent Results (from the past 720 hour(s))  Culture, blood (routine x 2) Call MD if unable to obtain prior to antibiotics being given     Status: None (Preliminary result)   Collection Time: 02/03/15  2:45 AM  Result Value Ref Range Status   Specimen Description BLOOD LEFT ANTECUBITAL  Final   Special Requests   Final    BOTTLES DRAWN AEROBIC AND ANAEROBIC AEB 10CC ANA 6CC   Culture NO GROWTH <12 HOURS  Final   Report Status PENDING  Incomplete  Culture, blood (routine x 2) Call MD if unable to obtain prior to antibiotics being given     Status: None (Preliminary result)   Collection Time: 02/03/15  3:00 AM  Result Value Ref Range Status   Specimen Description BLOOD RIGHT ANTECUBITAL  Final   Special Requests   Final    BOTTLES DRAWN AEROBIC AND ANAEROBIC AEB 10CC ANA 6CC   Culture NO GROWTH <12 HOURS  Final   Report Status PENDING  Incomplete   Medical History: Past Medical History  Diagnosis Date  . Asthma   . Headache(784.0)   . Hypertension     takes Triamertene and Aldomet daily  . Hyperlipidemia     not on medication yet d/t MD not calling it in yet  . Back pain   . Nocturia   . Brain aneurysm     at [redacted]wks  pregnant  Assessment: 44yo female with SOB, started on Rocephin and Zithromax Renal adjustment is not needed.  Plan: Continue current Rx   Valrie HartHall, Julizza Sassone A, RPH 02/03/2015,9:54 AM

## 2015-02-03 NOTE — Progress Notes (Signed)
Subjective: Patient  Was admitted due to cough and shortness of breath. Her chest x-ray showed multiple patchy infiltrate.  Objective: Vital signs in last 24 hours: Temp:  [98 F (36.7 C)-98.5 F (36.9 C)] 98 F (36.7 C) (11/25 0437) Pulse Rate:  [82-107] 97 (11/25 0437) Resp:  [20-24] 20 (11/25 0437) BP: (111-136)/(69-88) 115/69 mmHg (11/25 0437) SpO2:  [90 %-98 %] 91 % (11/25 0750) Weight:  [81.239 kg (179 lb 1.6 oz)-81.647 kg (180 lb)] 81.239 kg (179 lb 1.6 oz) (11/24 2225) Weight change:     Intake/Output from previous day: 11/24 0701 - 11/25 0700 In: 480 [P.O.:480] Out: -   PHYSICAL EXAM General appearance: alert and no distress Resp: diminished breath sounds bilaterally and rhonchi bilaterally Cardio: S1, S2 normal GI: soft, non-tender; bowel sounds normal; no masses,  no organomegaly Extremities: extremities normal, atraumatic, no cyanosis or edema  Lab Results:  Results for orders placed or performed during the hospital encounter of 02/02/15 (from the past 48 hour(s))  CBC with Differential     Status: Abnormal   Collection Time: 02/02/15  4:18 PM  Result Value Ref Range   WBC 12.2 (H) 4.0 - 10.5 K/uL   RBC 5.02 3.87 - 5.11 MIL/uL   Hemoglobin 14.1 12.0 - 15.0 g/dL   HCT 42.2 36.0 - 46.0 %   MCV 84.1 78.0 - 100.0 fL   MCH 28.1 26.0 - 34.0 pg   MCHC 33.4 30.0 - 36.0 g/dL   RDW 13.1 11.5 - 15.5 %   Platelets 275 150 - 400 K/uL   Neutrophils Relative % 65 %   Lymphocytes Relative 23 %   Monocytes Relative 5 %   Eosinophils Relative 6 %   Basophils Relative 1 %   Band Neutrophils 0 %   Metamyelocytes Relative 0 %   Myelocytes 0 %   Promyelocytes Absolute 0 %   Blasts 0 %   nRBC 0 0 /100 WBC   Neutro Abs 8.0 (H) 1.7 - 7.7 K/uL   Lymphs Abs 2.8 0.7 - 4.0 K/uL   Monocytes Absolute 0.6 0.1 - 1.0 K/uL   Eosinophils Absolute 0.7 0.0 - 0.7 K/uL   Basophils Absolute 0.1 0.0 - 0.1 K/uL  Basic metabolic panel     Status: Abnormal   Collection Time: 02/02/15   4:18 PM  Result Value Ref Range   Sodium 139 135 - 145 mmol/L   Potassium 3.7 3.5 - 5.1 mmol/L   Chloride 102 101 - 111 mmol/L   CO2 25 22 - 32 mmol/L   Glucose, Bld 117 (H) 65 - 99 mg/dL   BUN 8 6 - 20 mg/dL   Creatinine, Ser 0.90 0.44 - 1.00 mg/dL   Calcium 9.4 8.9 - 10.3 mg/dL   GFR calc non Af Amer >60 >60 mL/min   GFR calc Af Amer >60 >60 mL/min    Comment: (NOTE) The eGFR has been calculated using the CKD EPI equation. This calculation has not been validated in all clinical situations. eGFR's persistently <60 mL/min signify possible Chronic Kidney Disease.    Anion gap 12 5 - 15  Culture, blood (routine x 2) Call MD if unable to obtain prior to antibiotics being given     Status: None (Preliminary result)   Collection Time: 02/03/15  2:45 AM  Result Value Ref Range   Specimen Description BLOOD LEFT ANTECUBITAL    Special Requests      BOTTLES DRAWN AEROBIC AND ANAEROBIC AEB 10CC ANA [place]   Culture NO  GROWTH <12 HOURS    Report Status PENDING   CBC     Status: None   Collection Time: 02/03/15  2:45 AM  Result Value Ref Range   WBC 10.2 4.0 - 10.5 K/uL   RBC 5.07 3.87 - 5.11 MIL/uL   Hemoglobin 14.3 12.0 - 15.0 g/dL   HCT 41.9 36.0 - 46.0 %   MCV 82.6 78.0 - 100.0 fL   MCH 28.2 26.0 - 34.0 pg   MCHC 34.1 30.0 - 36.0 g/dL   RDW 12.9 11.5 - 15.5 %   Platelets 287 150 - 400 K/uL  Comprehensive metabolic panel     Status: Abnormal   Collection Time: 02/03/15  2:45 AM  Result Value Ref Range   Sodium 139 135 - 145 mmol/L   Potassium 4.2 3.5 - 5.1 mmol/L   Chloride 105 101 - 111 mmol/L   CO2 24 22 - 32 mmol/L   Glucose, Bld 167 (H) 65 - 99 mg/dL   BUN 8 6 - 20 mg/dL   Creatinine, Ser 0.76 0.44 - 1.00 mg/dL   Calcium 9.7 8.9 - 10.3 mg/dL   Total Protein 7.8 6.5 - 8.1 g/dL   Albumin 3.6 3.5 - 5.0 g/dL   AST 18 15 - 41 U/L   ALT 13 (L) 14 - 54 U/L   Alkaline Phosphatase 55 38 - 126 U/L   Total Bilirubin 0.3 0.3 - 1.2 mg/dL   GFR calc non Af Amer >60 >60 mL/min   GFR  calc Af Amer >60 >60 mL/min    Comment: (NOTE) The eGFR has been calculated using the CKD EPI equation. This calculation has not been validated in all clinical situations. eGFR's persistently <60 mL/min signify possible Chronic Kidney Disease.    Anion gap 10 5 - 15  Culture, blood (routine x 2) Call MD if unable to obtain prior to antibiotics being given     Status: None (Preliminary result)   Collection Time: 02/03/15  3:00 AM  Result Value Ref Range   Specimen Description BLOOD RIGHT ANTECUBITAL    Special Requests      BOTTLES DRAWN AEROBIC AND ANAEROBIC AEB 10CC ANA 6CC   Culture NO GROWTH <12 HOURS    Report Status PENDING     ABGS No results for input(s): PHART, PO2ART, TCO2, HCO3 in the last 72 hours.  Invalid input(s): PCO2 CULTURES Recent Results (from the past 240 hour(s))  Culture, blood (routine x 2) Call MD if unable to obtain prior to antibiotics being given     Status: None (Preliminary result)   Collection Time: 02/03/15  2:45 AM  Result Value Ref Range Status   Specimen Description BLOOD LEFT ANTECUBITAL  Final   Special Requests   Final    BOTTLES DRAWN AEROBIC AND ANAEROBIC AEB 10CC ANA [place]   Culture NO GROWTH <12 HOURS  Final   Report Status PENDING  Incomplete  Culture, blood (routine x 2) Call MD if unable to obtain prior to antibiotics being given     Status: None (Preliminary result)   Collection Time: 02/03/15  3:00 AM  Result Value Ref Range Status   Specimen Description BLOOD RIGHT ANTECUBITAL  Final   Special Requests   Final    BOTTLES DRAWN AEROBIC AND ANAEROBIC AEB 10CC ANA [place]   Culture NO GROWTH <12 HOURS  Final   Report Status PENDING  Incomplete   Studies/Results: Dg Chest 2 View  02/02/2015  CLINICAL DATA:  Patient is shortness of breath and  cough. Worsening over multiple days. EXAM: CHEST  2 VIEW COMPARISON:  Chest radiograph 08/05/2014. FINDINGS: Multiple monitoring leads overlie the patient. Stable cardiac and mediastinal contours.  Interval development of multiple patchy bilateral areas of consolidation. Visualized osseous skeleton is unremarkable. IMPRESSION: Patchy heterogeneous opacities within the lungs bilaterally are nonspecific however may represent multi focal infection in the appropriate clinical setting. Followup PA and lateral chest X-ray is recommended in 3-4 weeks following trial of antibiotic therapy to ensure resolution and exclude underlying malignancy. Electronically Signed   By: Lovey Newcomer M.D.   On: 02/02/2015 16:42    Medications: I have reviewed the patient's current medications.  Assesment:   Active Problems:   Pneumonia   Acute respiratory distress (HCC)   Tobacco use disorder   Essential hypertension   Bladder spasm   Community acquired pneumonia    Plan:  Medications reviewed Will continue IV antibiotics Will continue nebulizer treatment Will monitor cbc/BMP      Demetrious Rainford 02/03/2015, 9:57 AM

## 2015-02-03 NOTE — Care Management Obs Status (Signed)
MEDICARE OBSERVATION STATUS NOTIFICATION   Patient Details  Name: Becky CabalJennifer W Silas MRN: [id number] Date of Birth: 1970/10/31   Medicare Observation Status Notification Given:  Yes    Cheryl FlashBlackwell, Dorathea Faerber Crowder, RN 02/03/2015, 11:01 AM

## 2015-02-04 LAB — BASIC METABOLIC PANEL
ANION GAP: 11 (ref 5–15)
BUN: 12 mg/dL (ref 6–20)
CO2: 24 mmol/L (ref 22–32)
Calcium: 9.6 mg/dL (ref 8.9–10.3)
Chloride: 105 mmol/L (ref 101–111)
Creatinine, Ser: 0.72 mg/dL (ref 0.44–1.00)
GFR calc non Af Amer: >60 mL/min (ref >60)
GLUCOSE: 156 mg/dL — AB (ref 65–99)
POTASSIUM: 3.9 mmol/L (ref 3.5–5.1)
Sodium: 140 mmol/L (ref 135–145)

## 2015-02-04 LAB — CBC
HEMATOCRIT: 41.8 % (ref 36.0–46.0)
HEMOGLOBIN: 13.9 g/dL (ref 12.0–15.0)
MCH: 28 pg (ref 26.0–34.0)
MCHC: 33.3 g/dL (ref 30.0–36.0)
MCV: 84.3 fL (ref 78.0–100.0)
Platelets: 364 10*3/uL (ref 150–400)
RBC: 4.96 MIL/uL (ref 3.87–5.11)
RDW: 13.2 % (ref 11.5–15.5)
WBC: 27.4 10*3/uL — ABNORMAL HIGH (ref 4.0–10.5)

## 2015-02-04 LAB — HIV ANTIBODY (ROUTINE TESTING W REFLEX): HIV SCREEN 4TH GENERATION: NONREACTIVE

## 2015-02-04 MED ORDER — METHYLPREDNISOLONE SODIUM SUCC 125 MG IJ SOLR
60.0000 mg | Freq: Two times a day (BID) | INTRAMUSCULAR | Status: DC
Start: 2015-02-05 — End: 2015-02-05
  Administered 2015-02-04: 60 mg via INTRAVENOUS
  Filled 2015-02-04: qty 2

## 2015-02-04 NOTE — Progress Notes (Signed)
Subjective: Patient feels better. Her breathing is improving. No fever or chills.  Objective: Vital signs in last 24 hours: Temp:  [98.4 F (36.9 C)-98.5 F (36.9 C)] 98.4 F (36.9 C) (11/26 0544) Pulse Rate:  [97-114] 97 (11/26 0544) Resp:  [20] 20 (11/26 0544) BP: (128-134)/(79-97) 134/82 mmHg (11/26 0544) SpO2:  [90 %-98 %] 98 % (11/26 1101) Weight change:  Last BM Date: 02/02/15  Intake/Output from previous day: 11/25 0701 - 11/26 0700 In: 483 [P.O.:480; I.V.:3] Out: -   PHYSICAL EXAM General appearance: alert and no distress Resp: diminished breath sounds bilaterally and rhonchi bilaterally Cardio: S1, S2 normal GI: soft, non-tender; bowel sounds normal; no masses,  no organomegaly Extremities: extremities normal, atraumatic, no cyanosis or edema  Lab Results:  Results for orders placed or performed during the hospital encounter of 02/02/15 (from the past 48 hour(s))  CBC with Differential     Status: Abnormal   Collection Time: 02/02/15  4:18 PM  Result Value Ref Range   WBC 12.2 (H) 4.0 - 10.5 K/uL   RBC 5.02 3.87 - 5.11 MIL/uL   Hemoglobin 14.1 12.0 - 15.0 g/dL   HCT 42.2 36.0 - 46.0 %   MCV 84.1 78.0 - 100.0 fL   MCH 28.1 26.0 - 34.0 pg   MCHC 33.4 30.0 - 36.0 g/dL   RDW 13.1 11.5 - 15.5 %   Platelets 275 150 - 400 K/uL   Neutrophils Relative % 65 %   Lymphocytes Relative 23 %   Monocytes Relative 5 %   Eosinophils Relative 6 %   Basophils Relative 1 %   Band Neutrophils 0 %   Metamyelocytes Relative 0 %   Myelocytes 0 %   Promyelocytes Absolute 0 %   Blasts 0 %   nRBC 0 0 /100 WBC   Neutro Abs 8.0 (H) 1.7 - 7.7 K/uL   Lymphs Abs 2.8 0.7 - 4.0 K/uL   Monocytes Absolute 0.6 0.1 - 1.0 K/uL   Eosinophils Absolute 0.7 0.0 - 0.7 K/uL   Basophils Absolute 0.1 0.0 - 0.1 K/uL  Basic metabolic panel     Status: Abnormal   Collection Time: 02/02/15  4:18 PM  Result Value Ref Range   Sodium 139 135 - 145 mmol/L   Potassium 3.7 3.5 - 5.1 mmol/L   Chloride  102 101 - 111 mmol/L   CO2 25 22 - 32 mmol/L   Glucose, Bld 117 (H) 65 - 99 mg/dL   BUN 8 6 - 20 mg/dL   Creatinine, Ser 0.90 0.44 - 1.00 mg/dL   Calcium 9.4 8.9 - 10.3 mg/dL   GFR calc non Af Amer >60 >60 mL/min   GFR calc Af Amer >60 >60 mL/min    Comment: (NOTE) The eGFR has been calculated using the CKD EPI equation. This calculation has not been validated in all clinical situations. eGFR's persistently <60 mL/min signify possible Chronic Kidney Disease.    Anion gap 12 5 - 15  Culture, blood (routine x 2) Call MD if unable to obtain prior to antibiotics being given     Status: None (Preliminary result)   Collection Time: 02/03/15  2:45 AM  Result Value Ref Range   Specimen Description BLOOD LEFT ANTECUBITAL    Special Requests      BOTTLES DRAWN AEROBIC AND ANAEROBIC AEB 10CC ANA [place]   Culture NO GROWTH 1 DAY    Report Status PENDING   HIV antibody     Status: None   Collection Time:  02/03/15  2:45 AM  Result Value Ref Range   HIV Screen 4th Generation wRfx Non Reactive Non Reactive    Comment: (NOTE) Performed At: [facility] [place], [place] [id number] Lindon Romp MD FX:[id number]   CBC     Status: None   Collection Time: 02/03/15  2:45 AM  Result Value Ref Range   WBC 10.2 4.0 - 10.5 K/uL   RBC 5.07 3.87 - 5.11 MIL/uL   Hemoglobin 14.3 12.0 - 15.0 g/dL   HCT 41.9 36.0 - 46.0 %   MCV 82.6 78.0 - 100.0 fL   MCH 28.2 26.0 - 34.0 pg   MCHC 34.1 30.0 - 36.0 g/dL   RDW 12.9 11.5 - 15.5 %   Platelets 287 150 - 400 K/uL  Comprehensive metabolic panel     Status: Abnormal   Collection Time: 02/03/15  2:45 AM  Result Value Ref Range   Sodium 139 135 - 145 mmol/L   Potassium 4.2 3.5 - 5.1 mmol/L   Chloride 105 101 - 111 mmol/L   CO2 24 22 - 32 mmol/L   Glucose, Bld 167 (H) 65 - 99 mg/dL   BUN 8 6 - 20 mg/dL   Creatinine, Ser 0.76 0.44 - 1.00 mg/dL   Calcium 9.7 8.9 - 10.3 mg/dL   Total Protein 7.8 6.5 - 8.1 g/dL   Albumin 3.6 3.5 - 5.0  g/dL   AST 18 15 - 41 U/L   ALT 13 (L) 14 - 54 U/L   Alkaline Phosphatase 55 38 - 126 U/L   Total Bilirubin 0.3 0.3 - 1.2 mg/dL   GFR calc non Af Amer >60 >60 mL/min   GFR calc Af Amer >60 >60 mL/min    Comment: (NOTE) The eGFR has been calculated using the CKD EPI equation. This calculation has not been validated in all clinical situations. eGFR's persistently <60 mL/min signify possible Chronic Kidney Disease.    Anion gap 10 5 - 15  Culture, blood (routine x 2) Call MD if unable to obtain prior to antibiotics being given     Status: None (Preliminary result)   Collection Time: 02/03/15  3:00 AM  Result Value Ref Range   Specimen Description BLOOD RIGHT ANTECUBITAL    Special Requests      BOTTLES DRAWN AEROBIC AND ANAEROBIC AEB 10CC ANA [place]   Culture NO GROWTH 1 DAY    Report Status PENDING   CBC     Status: Abnormal   Collection Time: 02/04/15  7:18 AM  Result Value Ref Range   WBC 27.4 (H) 4.0 - 10.5 K/uL   RBC 4.96 3.87 - 5.11 MIL/uL   Hemoglobin 13.9 12.0 - 15.0 g/dL   HCT 41.8 36.0 - 46.0 %   MCV 84.3 78.0 - 100.0 fL   MCH 28.0 26.0 - 34.0 pg   MCHC 33.3 30.0 - 36.0 g/dL   RDW 13.2 11.5 - 15.5 %   Platelets 364 150 - 400 K/uL  Basic metabolic panel     Status: Abnormal   Collection Time: 02/04/15  7:18 AM  Result Value Ref Range   Sodium 140 135 - 145 mmol/L   Potassium 3.9 3.5 - 5.1 mmol/L   Chloride 105 101 - 111 mmol/L   CO2 24 22 - 32 mmol/L   Glucose, Bld 156 (H) 65 - 99 mg/dL   BUN 12 6 - 20 mg/dL   Creatinine, Ser 0.72 0.44 - 1.00 mg/dL   Calcium 9.6 8.9 -  10.3 mg/dL   GFR calc non Af Amer >60 >60 mL/min   GFR calc Af Amer >60 >60 mL/min    Comment: (NOTE) The eGFR has been calculated using the CKD EPI equation. This calculation has not been validated in all clinical situations. eGFR's persistently <60 mL/min signify possible Chronic Kidney Disease.    Anion gap 11 5 - 15    ABGS No results for input(s): PHART, PO2ART, TCO2, HCO3 in the last 72  hours.  Invalid input(s): PCO2 CULTURES Recent Results (from the past 240 hour(s))  Culture, blood (routine x 2) Call MD if unable to obtain prior to antibiotics being given     Status: None (Preliminary result)   Collection Time: 02/03/15  2:45 AM  Result Value Ref Range Status   Specimen Description BLOOD LEFT ANTECUBITAL  Final   Special Requests   Final    BOTTLES DRAWN AEROBIC AND ANAEROBIC AEB 10CC ANA [place]   Culture NO GROWTH 1 DAY  Final   Report Status PENDING  Incomplete  Culture, blood (routine x 2) Call MD if unable to obtain prior to antibiotics being given     Status: None (Preliminary result)   Collection Time: 02/03/15  3:00 AM  Result Value Ref Range Status   Specimen Description BLOOD RIGHT ANTECUBITAL  Final   Special Requests   Final    BOTTLES DRAWN AEROBIC AND ANAEROBIC AEB 10CC ANA [place]   Culture NO GROWTH 1 DAY  Final   Report Status PENDING  Incomplete   Studies/Results: Dg Chest 2 View  02/02/2015  CLINICAL DATA:  Patient is shortness of breath and cough. Worsening over multiple days. EXAM: CHEST  2 VIEW COMPARISON:  Chest radiograph 08/05/2014. FINDINGS: Multiple monitoring leads overlie the patient. Stable cardiac and mediastinal contours. Interval development of multiple patchy bilateral areas of consolidation. Visualized osseous skeleton is unremarkable. IMPRESSION: Patchy heterogeneous opacities within the lungs bilaterally are nonspecific however may represent multi focal infection in the appropriate clinical setting. Followup PA and lateral chest X-ray is recommended in 3-4 weeks following trial of antibiotic therapy to ensure resolution and exclude underlying malignancy. Electronically Signed   By: Lovey Newcomer M.D.   On: 02/02/2015 16:42    Medications: I have reviewed the patient's current medications.  Assesment:   Active Problems:   Pneumonia   Acute respiratory distress (HCC)   Tobacco use disorder   Essential hypertension   Bladder spasm    Community acquired pneumonia    Plan:  Medications reviewed Will continue IV antibiotics Will continue nebulizer treatment Will taper IV steroid   LOS: 1 day   Bennetta Rudden 02/04/2015, 12:34 PM

## 2015-02-04 NOTE — Progress Notes (Signed)
Patient asleep at 0420 no neb given

## 2015-02-04 NOTE — Progress Notes (Signed)
Paged Dr. Felecia ShellingFanta. Pt. Requesting medication for "heart burn".

## 2015-02-05 MED ORDER — AMOXICILLIN-POT CLAVULANATE 500-125 MG PO TABS: 1.0000 | ORAL_TABLET | Freq: Three times a day (TID) | ORAL | Status: DC

## 2015-02-05 MED ORDER — NICOTINE 21 MG/24HR TD PT24: 21.0000 mg | MEDICATED_PATCH | Freq: Every day | TRANSDERMAL | Status: DC

## 2015-02-05 MED ORDER — PREDNISONE 10 MG (21) PO TBPK: 10.0000 mg | ORAL_TABLET | Freq: Every day | ORAL | Status: DC

## 2015-02-05 NOTE — Discharge Summary (Signed)
Physician Discharge Summary  Patient ID: [name] MRN: [id number] DOB/AGE: 12-Jun-1970 44 y.o. Primary Care Physician:Donavan Kerlin, MD Admit date: 02/02/2015 Discharge date: 02/05/2015    Discharge Diagnoses:     Active Problems:   Pneumonia   Acute respiratory distress (HCC)   Tobacco use disorder   Essential hypertension   Bladder spasm   Community acquired pneumonia     Medication List    STOP taking these medications        cyclobenzaprine 10 MG tablet  Commonly known as:  FLEXERIL     HYDROcodone-acetaminophen 5-325 MG tablet  Commonly known as:  NORCO/VICODIN      TAKE these medications        acetaminophen 500 MG tablet  Commonly known as:  TYLENOL  Take 1,000 mg by mouth every 4 (four) hours as needed. For pain     amoxicillin-clavulanate 500-125 MG tablet  Commonly known as:  AUGMENTIN  Take 1 tablet (500 mg total) by mouth 3 (three) times daily.     lisinopril-hydrochlorothiazide 20-12.5 MG tablet  Commonly known as:  PRINZIDE,ZESTORETIC  Take 1 tablet by mouth daily.     nicotine 21 mg/24hr patch  Commonly known as:  NICODERM CQ - dosed in mg/24 hours  Place 1 patch (21 mg total) onto the skin daily.     oxybutynin 5 MG 24 hr tablet  Commonly known as:  DITROPAN-XL  Take 5 mg by mouth daily.     predniSONE 10 MG (21) Tbpk tablet  Commonly known as:  STERAPRED UNI-PAK 21 TAB  Take 1 tablet (10 mg total) by mouth daily. 4 tab po daily for 3 days, 3 tab po daily for 3 days, 2 tab po daily for 3 days, 1` tab po daily for 3 days.     PROAIR HFA 108 (90 BASE) MCG/ACT inhaler  Generic drug:  albuterol  use as directed     SPIRIVA HANDIHALER 18 MCG inhalation capsule  Generic drug:  tiotropium  Place 18 mcg into inhaler and inhale daily.     traMADol 50 MG tablet  Commonly known as:  ULTRAM  Take 50-100 mg by mouth every 6 (six) hours as needed. For pain        Discharged Condition: improved    Consults: none  Significant  Diagnostic Studies: Dg Chest 2 View  02/02/2015  CLINICAL DATA:  Patient is shortness of breath and cough. Worsening over multiple days. EXAM: CHEST  2 VIEW COMPARISON:  Chest radiograph 08/05/2014. FINDINGS: Multiple monitoring leads overlie the patient. Stable cardiac and mediastinal contours. Interval development of multiple patchy bilateral areas of consolidation. Visualized osseous skeleton is unremarkable. IMPRESSION: Patchy heterogeneous opacities within the lungs bilaterally are nonspecific however may represent multi focal infection in the appropriate clinical setting. Followup PA and lateral chest X-ray is recommended in 3-4 weeks following trial of antibiotic therapy to ensure resolution and exclude underlying malignancy. Electronically Signed   By: Annia Beltrew  Davis M.D.   On: 02/02/2015 16:42    Lab Results: Basic Metabolic Panel:  Recent Labs  08/65/7811/25/16 0245 02/04/15 0718  NA 139 140  K 4.2 3.9  CL 105 105  CO2 24 24  GLUCOSE 167* 156*  BUN 8 12  CREATININE 0.76 0.72  CALCIUM 9.7 9.6   Liver Function Tests:  Recent Labs  02/03/15 0245  AST 18  ALT 13*  ALKPHOS 55  BILITOT 0.3  PROT 7.8  ALBUMIN 3.6     CBC:  Recent Labs  02/02/15 1618  02/03/15 0245 02/04/15 0718  WBC 12.2* 10.2 27.4*  NEUTROABS 8.0*  --   --   HGB 14.1 14.3 13.9  HCT 42.2 41.9 41.8  MCV 84.1 82.6 84.3  PLT 275 287 364    Recent Results (from the past 240 hour(s))  Culture, blood (routine x 2) Call MD if unable to obtain prior to antibiotics being given     Status: None (Preliminary result)   Collection Time: 02/03/15  2:45 AM  Result Value Ref Range Status   Specimen Description BLOOD LEFT ANTECUBITAL  Final   Special Requests   Final    BOTTLES DRAWN AEROBIC AND ANAEROBIC AEB 10CC ANA 6CC   Culture NO GROWTH 1 DAY  Final   Report Status PENDING  Incomplete  Culture, blood (routine x 2) Call MD if unable to obtain prior to antibiotics being given     Status: None (Preliminary result)    Collection Time: 02/03/15  3:00 AM  Result Value Ref Range Status   Specimen Description BLOOD RIGHT ANTECUBITAL  Final   Special Requests   Final    BOTTLES DRAWN AEROBIC AND ANAEROBIC AEB 10CC ANA 6CC   Culture NO GROWTH 1 DAY  Final   Report Status PENDING  Incomplete     Hospital Course:   This is a 44 years old female with history of COPD and tobacco smoking was admitted due to cough, congestion and shortness of breath. Patient was found to have have patch infiltrates and opacities on her chest x-ray. She was admitted and was treated with combination of IV antibiotics, IV steroids and nebulizer treatment. Patient improved and she is being discharged on oral antibiotics and oral steroids to be followed in  Out patient.  Discharge Exam: Blood pressure 124/95, pulse 96, temperature 98.1 F (36.7 C), temperature source Oral, resp. rate 20, height  (1.651 m), weight 81.239 kg (179 lb 1.6 oz), last menstrual period 01/19/2015, SpO2 95 %.    Disposition:  Home.        Follow-up Information    Follow up with [facility], MD In 1 week.   Specialty:  Internal Medicine   Contact information:   [address] [place] [address] [phone]       Signed: Avon Gully   02/05/2015, 9:16 AM

## 2015-02-05 NOTE — Progress Notes (Signed)
Patient alert and oriented, independent, VSS, pt. Tolerating diet well. No complaints of pain or nausea. Pt. Had IV removed tip intact. Pt. Had prescriptions given. Pt. Voiced understanding of discharge instructions with no further questions. Pt. Discharged via wheelchair with auxilliary.  

## 2015-02-08 LAB — CULTURE, BLOOD (ROUTINE X 2)
CULTURE: NO GROWTH
Culture: NO GROWTH

## 2015-02-10 ENCOUNTER — Other Ambulatory Visit (HOSPITAL_COMMUNITY): Payer: Self-pay | Admitting: Internal Medicine

## 2015-02-10 DIAGNOSIS — J984 Other disorders of lung: Secondary | ICD-10-CM | POA: Diagnosis not present

## 2015-02-10 DIAGNOSIS — J441 Chronic obstructive pulmonary disease with (acute) exacerbation: Secondary | ICD-10-CM | POA: Diagnosis not present

## 2015-02-10 DIAGNOSIS — R9389 Abnormal findings on diagnostic imaging of other specified body structures: Secondary | ICD-10-CM

## 2015-02-15 ENCOUNTER — Ambulatory Visit (HOSPITAL_COMMUNITY)
Admission: RE | Admit: 2015-02-15 | Discharge: 2015-02-15 | Disposition: A | Discharge status: Home / Self Care | Payer: Medicare Other | Source: Ambulatory Visit | Attending: Internal Medicine | Admitting: Internal Medicine

## 2015-02-15 DIAGNOSIS — J180 Bronchopneumonia, unspecified organism: Secondary | ICD-10-CM | POA: Diagnosis not present

## 2015-02-15 DIAGNOSIS — R938 Abnormal findings on diagnostic imaging of other specified body structures: Secondary | ICD-10-CM | POA: Insufficient documentation

## 2015-02-15 DIAGNOSIS — R9389 Abnormal findings on diagnostic imaging of other specified body structures: Secondary | ICD-10-CM

## 2015-02-24 DIAGNOSIS — J441 Chronic obstructive pulmonary disease with (acute) exacerbation: Secondary | ICD-10-CM | POA: Diagnosis not present

## 2015-05-09 ENCOUNTER — Other Ambulatory Visit (HOSPITAL_COMMUNITY): Payer: Self-pay | Admitting: Internal Medicine

## 2015-05-09 DIAGNOSIS — R911 Solitary pulmonary nodule: Secondary | ICD-10-CM

## 2015-05-09 DIAGNOSIS — Z1231 Encounter for screening mammogram for malignant neoplasm of breast: Secondary | ICD-10-CM

## 2015-05-15 ENCOUNTER — Ambulatory Visit (HOSPITAL_COMMUNITY)
Admission: RE | Admit: 2015-05-15 | Discharge: 2015-05-15 | Disposition: A | Discharge status: Home / Self Care | Payer: Medicare Other | Source: Ambulatory Visit | Attending: Internal Medicine | Admitting: Internal Medicine

## 2015-05-15 DIAGNOSIS — Z1231 Encounter for screening mammogram for malignant neoplasm of breast: Secondary | ICD-10-CM | POA: Diagnosis not present

## 2015-05-19 ENCOUNTER — Ambulatory Visit (HOSPITAL_COMMUNITY): Payer: Medicare Other

## 2016-06-06 ENCOUNTER — Other Ambulatory Visit (HOSPITAL_COMMUNITY): Payer: Self-pay | Admitting: Internal Medicine

## 2016-06-06 DIAGNOSIS — I10 Essential (primary) hypertension: Secondary | ICD-10-CM | POA: Diagnosis not present

## 2016-06-06 DIAGNOSIS — M549 Dorsalgia, unspecified: Secondary | ICD-10-CM | POA: Diagnosis not present

## 2016-06-06 DIAGNOSIS — J441 Chronic obstructive pulmonary disease with (acute) exacerbation: Secondary | ICD-10-CM | POA: Diagnosis not present

## 2016-06-06 DIAGNOSIS — Z1231 Encounter for screening mammogram for malignant neoplasm of breast: Secondary | ICD-10-CM

## 2016-06-13 ENCOUNTER — Ambulatory Visit (HOSPITAL_COMMUNITY): Payer: Medicare Other

## 2016-12-04 IMAGING — DX DG CHEST 2V
2 series · 2 of 2 positions shown · non-contrast
Comparison: 05/06/2011 .

CLINICAL DATA: Wheezing and cough.

EXAM:
CHEST  2 VIEW

[chest pa]
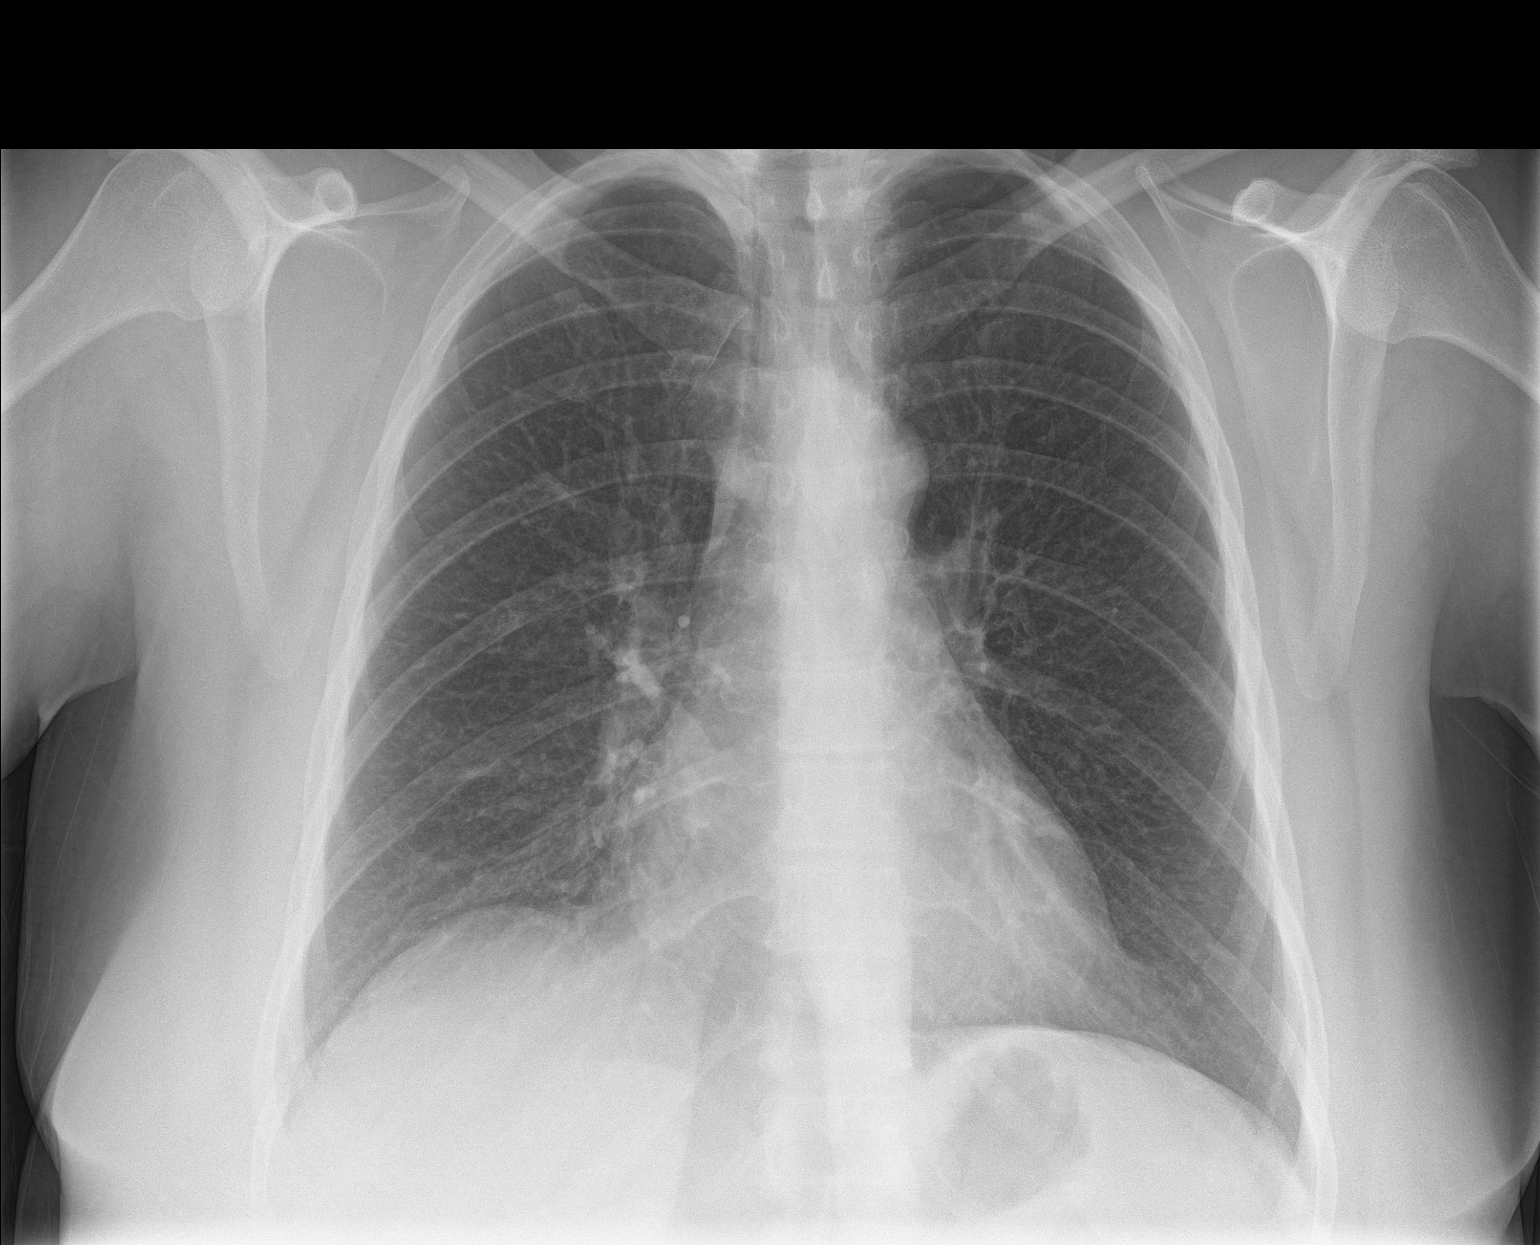

[chest lat]
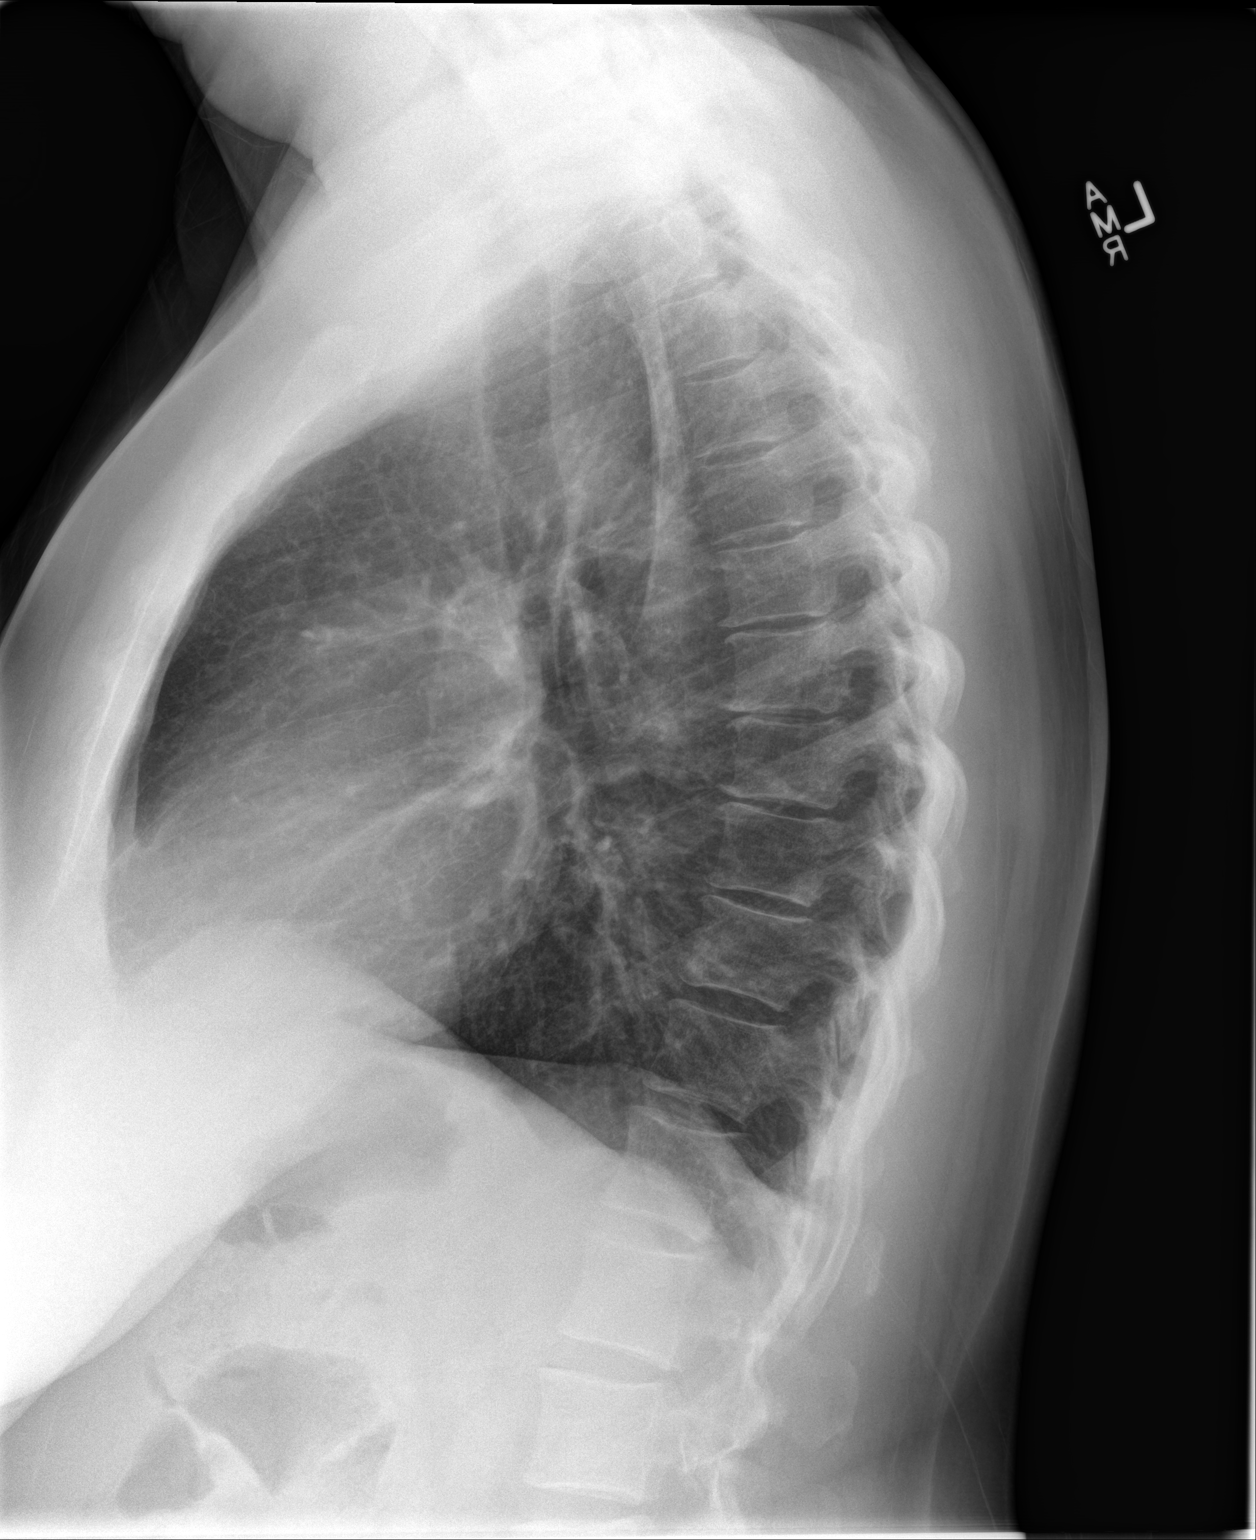

[2 of 2 positions shown; findings below may reference images not displayed]

FINDINGS: Mediastinum and hilar structures are normal. Questionable nodular
opacity right upper lobe. Apical lordotic chest x-ray suggested for
further evaluation. Previously identified infiltrate right lower
lobe as clear. No pleural effusion or pneumothorax. No acute bony
abnormality.
IMPRESSION: Questionable nodular density right upper lobe. This may represent
overlapping shadows. Apical lordotic chest x-ray suggested for
further evaluation. No acute abnormality otherwise noted .

## 2017-01-17 DIAGNOSIS — E7849 Other hyperlipidemia: Secondary | ICD-10-CM | POA: Diagnosis not present

## 2017-01-17 DIAGNOSIS — J441 Chronic obstructive pulmonary disease with (acute) exacerbation: Secondary | ICD-10-CM | POA: Diagnosis not present

## 2017-01-17 DIAGNOSIS — I10 Essential (primary) hypertension: Secondary | ICD-10-CM | POA: Diagnosis not present

## 2017-05-13 DIAGNOSIS — J449 Chronic obstructive pulmonary disease, unspecified: Secondary | ICD-10-CM | POA: Diagnosis not present

## 2017-05-13 DIAGNOSIS — I10 Essential (primary) hypertension: Secondary | ICD-10-CM | POA: Diagnosis not present

## 2017-05-13 DIAGNOSIS — M549 Dorsalgia, unspecified: Secondary | ICD-10-CM | POA: Diagnosis not present

## 2017-05-13 DIAGNOSIS — Z1389 Encounter for screening for other disorder: Secondary | ICD-10-CM | POA: Diagnosis not present

## 2017-05-13 DIAGNOSIS — J441 Chronic obstructive pulmonary disease with (acute) exacerbation: Secondary | ICD-10-CM | POA: Diagnosis not present

## 2017-05-13 DIAGNOSIS — R7309 Other abnormal glucose: Secondary | ICD-10-CM | POA: Diagnosis not present

## 2017-05-13 DIAGNOSIS — Z1331 Encounter for screening for depression: Secondary | ICD-10-CM | POA: Diagnosis not present

## 2017-05-13 DIAGNOSIS — Z0000 Encounter for general adult medical examination without abnormal findings: Secondary | ICD-10-CM | POA: Diagnosis not present

## 2017-06-05 ENCOUNTER — Ambulatory Visit (HOSPITAL_COMMUNITY)
Admission: RE | Admit: 2017-06-05 | Discharge: 2017-06-05 | Disposition: A | Discharge status: Home / Self Care | Payer: Medicare Other | Source: Ambulatory Visit | Attending: Internal Medicine | Admitting: Internal Medicine

## 2017-06-05 DIAGNOSIS — Z1231 Encounter for screening mammogram for malignant neoplasm of breast: Secondary | ICD-10-CM | POA: Insufficient documentation

## 2017-06-13 ENCOUNTER — Encounter: Payer: Self-pay | Admitting: Adult Health

## 2017-07-03 ENCOUNTER — Encounter: Payer: Self-pay | Admitting: Adult Health

## 2017-07-21 ENCOUNTER — Ambulatory Visit (INDEPENDENT_AMBULATORY_CARE_PROVIDER_SITE_OTHER): Payer: Medicare Other | Admitting: Adult Health

## 2017-07-21 ENCOUNTER — Other Ambulatory Visit (HOSPITAL_COMMUNITY)
Admission: RE | Admit: 2017-07-21 | Discharge: 2017-07-21 | Disposition: A | Discharge status: Home / Self Care | Payer: Medicare Other | Source: Ambulatory Visit | Attending: Adult Health | Admitting: Adult Health

## 2017-07-21 ENCOUNTER — Encounter: Payer: Self-pay | Admitting: Adult Health

## 2017-07-21 ENCOUNTER — Other Ambulatory Visit: Payer: Self-pay

## 2017-07-21 VITALS — BP 118/72 | HR 78 | Ht 65.0 in | Wt 187.0 lb

## 2017-07-21 DIAGNOSIS — Z01419 Encounter for gynecological examination (general) (routine) without abnormal findings: Secondary | ICD-10-CM | POA: Insufficient documentation

## 2017-07-21 DIAGNOSIS — Z1212 Encounter for screening for malignant neoplasm of rectum: Secondary | ICD-10-CM

## 2017-07-21 DIAGNOSIS — N946 Dysmenorrhea, unspecified: Secondary | ICD-10-CM | POA: Diagnosis not present

## 2017-07-21 DIAGNOSIS — Z124 Encounter for screening for malignant neoplasm of cervix: Secondary | ICD-10-CM | POA: Diagnosis not present

## 2017-07-21 DIAGNOSIS — N393 Stress incontinence (female) (male): Secondary | ICD-10-CM | POA: Diagnosis not present

## 2017-07-21 DIAGNOSIS — N921 Excessive and frequent menstruation with irregular cycle: Secondary | ICD-10-CM | POA: Diagnosis not present

## 2017-07-21 DIAGNOSIS — Z1211 Encounter for screening for malignant neoplasm of colon: Secondary | ICD-10-CM

## 2017-07-21 DIAGNOSIS — Z01411 Encounter for gynecological examination (general) (routine) with abnormal findings: Secondary | ICD-10-CM | POA: Diagnosis not present

## 2017-07-21 LAB — HEMOCCULT GUIAC POC 1CARD (OFFICE): Fecal Occult Blood, POC: NEGATIVE

## 2017-07-21 NOTE — Progress Notes (Signed)
  Subjective:     Patient ID: [name], female   DOB: December 28, 1970, 47 y.o.   MRN: [id number]  HPI [name] is a 47 year old white female in for pap and pelvic exam, Dr [name] did her physical. PCP is Dr [name].  Review of Systems Patient denies any headaches, hearing loss, fatigue, blurred vision, shortness of breath, chest pain, abdominal pain, problems with bowel movements,  or intercourse. No joint pain or mood swings. +urinary incontinence when coughs and sneezes +heavy periods, lasting 8-9 days with cramps  Reviewed past medical,surgical, social and family history. Reviewed medications and allergies.     Objective:   Physical Exam BP 118/72 (BP Location: Right Arm, Patient Position: Sitting, Cuff Size: Normal)   Pulse 78   Ht  (1.651 m)   Wt 187 lb (84.8 kg)   LMP 07/09/2017   BMI 31.12 kg/m  Skin warm and dry.Pelvic: external genitalia is normal in appearance no lesions, vagina: pink with good moisture and rugae,urethra has no lesions or masses noted, cervix:smooth and bulbous,pap with HPV performed, uterus: normal size, shape and contour, non tender, no masses felt, adnexa: no masses or tenderness noted. Bladder is non tender and no masses felt.    On rectal exam has good tone and hemoccult was negative, no masses felt. PHQ 2 score 0. Discussed getting [place] to assess uterus and possible ablation, try kegels and quit smoking, would help.  Assessment:     1. Encounter for gynecological examination with Papanicolaou smear of cervix   2. Routine cervical smear   3. Screening for colorectal cancer   4. Menorrhagia with irregular cycle   5. Dysmenorrhea   6. SUI (stress urinary incontinence, female)       Plan:     Pap with HPV sent F/U in 1 week for GYN [place] Then 2 weeks with Dr [name] to talk options, like ablation,has handout Pap in 3 if normal Physical and labs with PCP

## 2017-07-22 LAB — CYTOLOGY - PAP
ADEQUACY: ABSENT
Diagnosis: NEGATIVE
HPV: NOT DETECTED

## 2017-07-28 ENCOUNTER — Other Ambulatory Visit: Payer: Medicare Other

## 2017-08-05 ENCOUNTER — Encounter: Payer: Self-pay | Admitting: Obstetrics & Gynecology

## 2017-08-05 ENCOUNTER — Ambulatory Visit (INDEPENDENT_AMBULATORY_CARE_PROVIDER_SITE_OTHER): Payer: Medicare Other | Admitting: Obstetrics & Gynecology

## 2017-08-05 VITALS — BP 140/90 | HR 97 | Ht 65.0 in | Wt 187.0 lb

## 2017-08-05 DIAGNOSIS — N393 Stress incontinence (female) (male): Secondary | ICD-10-CM

## 2017-08-05 DIAGNOSIS — N946 Dysmenorrhea, unspecified: Secondary | ICD-10-CM | POA: Diagnosis not present

## 2017-08-05 DIAGNOSIS — N921 Excessive and frequent menstruation with irregular cycle: Secondary | ICD-10-CM

## 2017-08-05 MED ORDER — MEGESTROL ACETATE 40 MG PO TABS: ORAL_TABLET | ORAL | 3 refills | Status: DC

## 2017-08-05 NOTE — Progress Notes (Signed)
Chief Complaint  Patient presents with  . talk options    frequent period      47 y.o. G5P5 Patient's last menstrual period was 07/09/2017. The current method of family planning is tubal removal.  Outpatient Encounter Medications as of 08/05/2017  Medication Sig Note  . lisinopril-hydrochlorothiazide (PRINZIDE,ZESTORETIC) 20-12.5 MG tablet Take 1 tablet by mouth daily.   Marland Kitchen PROAIR HFA 108 (90 BASE) MCG/ACT inhaler use as directed (Patient taking differently: INHALE ONE TO TWO PUFFS EVERY 6 HOURS AS NEEDED FOR SHORTNESS OF BREATH AND/OR WHEEZING) 02/02/2015: Need refill  . SPIRIVA HANDIHALER 18 MCG inhalation capsule Place 18 mcg into inhaler and inhale daily.    . traMADol (ULTRAM) 50 MG tablet Take 50-100 mg by mouth every 6 (six) hours as needed. For pain   . acetaminophen (TYLENOL) 500 MG tablet Take 1,000 mg by mouth every 4 (four) hours as needed. For pain   . megestrol (MEGACE) 40 MG tablet 3 tablets a day for 5 days, 2 tablets a day for 5 days then 1 tablet daily    No facility-administered encounter medications on file as of 08/05/2017.     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 Asthma    COPD  . Back pain   . Brain aneurysm    at [redacted]wks pregnant  . Headache(784.0)   . Hyperlipidemia    not on medication yet d/t MD not calling it in yet  . Hypertension    takes Triamertene and Aldomet daily  . Nocturia     Past Surgical History:  Procedure Laterality Date  . CESAREAN SECTION  2012  . INTRACRANIAL ANEURYSM REPAIR  06-16-2010  . LAPAROSCOPY  09/26/2010   Procedure: LAPAROSCOPY OPERATIVE;  Surgeon: Lazaro Arms, MD;  Location: [facility];  Service: Gynecology;  Laterality: N/A;  Laparoscopic Bilateral Salpingectomy  . MULTIPLE EXTRACTIONS WITH ALVEOLOPLASTY  05/13/2011   Procedure: MULTIPLE EXTRACION WITH ALVEOLOPLASTY;  Surgeon:  Georgia Lopes, DDS;  Location: [facility];  Service: Oral Surgery;  Laterality: Bilateral;  extraction teeth #'s 2,3,4,5,17,18,30,31, and 32 with alveoloplasty.  . TONSILLECTOMY     as a child  . TUBAL LIGATION      OB History    Gravida  5   Para  5   Term      Preterm      AB      Living  5     SAB      TAB      Ectopic      Multiple      Live Births              No Known Allergies  Social History   Socioeconomic History  . Marital status: Married    Spouse name: Not on file  . Number of children: Not on file  . Years of education: Not on file  . Highest education level: Not on file  Occupational History  . Not on file  Social Needs  . Financial resource strain: Not on file  . Food insecurity:    Worry: Not on file    Inability: Not on file  . Transportation needs:    Medical: Not on file    Non-medical: Not on file  Tobacco Use  . Smoking status: Current Every Day Smoker    Packs/day: 1.00  Years: 15.00    Pack years: 15.00    Types: Cigarettes  . Smokeless tobacco: Never Used  Substance and Sexual Activity  . Alcohol use: No  . Drug use: Yes    Frequency: 1.0 times per week    Types: Marijuana    Comment: occasional   . Sexual activity: Yes    Birth control/protection: Surgical    Comment: tubal  Lifestyle  . Physical activity:    Days per week: Not on file    Minutes per session: Not on file  . Stress: Not on file  Relationships  . Social connections:    Talks on phone: Not on file    Gets together: Not on file    Attends religious service: Not on file    Active member of club or organization: Not on file    Attends meetings of clubs or organizations: Not on file    Relationship status: Not on file  Other Topics Concern  . Not on file  Social History Narrative  . Not on file    Family History  Problem Relation Age of Onset  . Hypertension Father   . Heart disease Father   . Kidney disease Father   . Anesthesia problems  Neg Hx   . Hypotension Neg Hx   . Malignant hyperthermia Neg Hx   . Pseudochol deficiency Neg Hx     Medications:       Current Outpatient Medications:  .  lisinopril-hydrochlorothiazide (PRINZIDE,ZESTORETIC) 20-12.5 MG tablet, Take 1 tablet by mouth daily., Disp: , Rfl:  .  PROAIR HFA 108 (90 BASE) MCG/ACT inhaler, use as directed (Patient taking differently: INHALE ONE TO TWO PUFFS EVERY 6 HOURS AS NEEDED FOR SHORTNESS OF BREATH AND/OR WHEEZING), Disp: 18 g, Rfl: 2 .  SPIRIVA HANDIHALER 18 MCG inhalation capsule, Place 18 mcg into inhaler and inhale daily. , Disp: , Rfl: 0 .  traMADol (ULTRAM) 50 MG tablet, Take 50-100 mg by mouth every 6 (six) hours as needed. For pain, Disp: , Rfl:  .  acetaminophen (TYLENOL) 500 MG tablet, Take 1,000 mg by mouth every 4 (four) hours as needed. For pain, Disp: , Rfl:  .  megestrol (MEGACE) 40 MG tablet, 3 tablets a day for 5 days, 2 tablets a day for 5 days then 1 tablet daily, Disp: 45 tablet, Rfl: 3  Objective Blood pressure 140/90, pulse 97, height  (1.651 m), weight 187 lb (84.8 kg), last menstrual period 07/09/2017.  Gen WDWN NAD  Pertinent ROS No burning with urination, frequency or urgency No nausea, vomiting or diarrhea Nor fever chills or other constitutional symptoms   Labs or studies     Impression Diagnoses this Encounter::   ICD-10-CM   1. Menorrhagia with irregular cycle N92.1 US PELVIS TRANSVANGINAL NON-OB (TV ONLY)    US PELVIS (TRANSABDOMINAL ONLY)  2. SUI (stress urinary incontinence, female) N39.3   3. Dysmenorrhea N94.6 US PELVIS TRANSVANGINAL NON-OB (TV ONLY)    US PELVIS (TRANSABDOMINAL ONLY)    Established relevant diagnosis(es):   Plan/Recommendations: Meds ordered this encounter  Medications  . megestrol (MEGACE) 40 MG tablet    Sig: 3 tablets a day for 5 days, 2 tablets a day for 5 days then 1 tablet daily    Dispense:  45 tablet    Refill:  3    Labs or Scans Ordered: Orders Placed This  Encounter  Procedures  . US PELVIS TRANSVANGINAL NON-OB (TV ONLY)  . US PELVIS (TRANSABDOMINAL ONLY)  Management:: Sonogram to evaluate endometrium Megestrol therapy to evaluate response and see if ablation candidate  Follow up Return in about 1 month (around 09/05/2017) for GYN sono, Follow up, with Dr Despina Hidden.        Face to face time:  15 minutes  Greater than 50% of the visit time was spent in counseling and coordination of care with the patient.  The summary and outline of the counseling and care coordination is summarized in the note above.   All questions were answered.

## 2017-09-02 ENCOUNTER — Ambulatory Visit (INDEPENDENT_AMBULATORY_CARE_PROVIDER_SITE_OTHER): Payer: Medicare Other | Admitting: Obstetrics & Gynecology

## 2017-09-02 ENCOUNTER — Encounter: Payer: Self-pay | Admitting: Obstetrics & Gynecology

## 2017-09-02 ENCOUNTER — Ambulatory Visit (INDEPENDENT_AMBULATORY_CARE_PROVIDER_SITE_OTHER): Payer: Medicare Other

## 2017-09-02 VITALS — BP 95/59 | HR 76 | Ht 65.0 in | Wt 190.0 lb

## 2017-09-02 DIAGNOSIS — N921 Excessive and frequent menstruation with irregular cycle: Secondary | ICD-10-CM

## 2017-09-02 DIAGNOSIS — N946 Dysmenorrhea, unspecified: Secondary | ICD-10-CM

## 2017-09-02 NOTE — Progress Notes (Signed)
PELVIC US TA/TV:heterogeneous anteverted uterus,no measurable fibroids,normal ovaries bilat,ovaries appear mobile,EEC 9.177mm,1.5 cm complex nabothian cyst,no free fluid,no pain during ultrasound

## 2017-09-02 NOTE — Progress Notes (Signed)
Follow up appointment for results  Chief Complaint  Patient presents with  . Follow-up    ultrasound    Blood pressure (!) 95/59, pulse 76, height 5\' 5"  (1.651 m), weight 190 lb (86.2 kg).  US Pelvis Transvanginal Non-ob (tv Only)  Result Date: 09/02/2017 GYNECOLOGIC SONOGRAM [name] is a 46 y.o. G5P5 LMP 07/09/2017,she is here for a pelvic sonogram for menometrorrhagia/dysmenorrhea. Uterus                      8.6 x 5 x 6.2 cm, Vol 138 ml,heterogeneous anteverted uterus,no measurable fibroids Endometrium          9.7 mm, symmetrical, wnl Right ovary             2.3 x 2 x 2.3 cm, wnl Left ovary                2.2 x 1.8 x 1.7 cm, wnl No free fluid Technician Comments: PELVIC US TA/TV:heterogeneous anteverted uterus,no measurable fibroids,normal ovaries bilat,ovaries appear mobile,EEC 9.9mm,1.5 cm complex nabothian cyst,no free fluid,no pain during ultrasound [employer] 09/02/2017 11:38 AM Clinical Impression and recommendations: I have reviewed the sonogram results above, combined with the patient's current clinical course, below are my impressions and any appropriate recommendations for management based on the sonographic findings. Uterus is normal with normal endometrium, no abnormalities Ovaries are normal Lazaro Arms 09/02/2017 12:04 PM   US Pelvis (transabdominal Only)  Result Date: 09/02/2017 GYNECOLOGIC SONOGRAM [name] is a 47 y.o. G5P5 LMP 07/09/2017,she is here for a pelvic sonogram for menometrorrhagia/dysmenorrhea. Uterus                      8.6 x 5 x 6.2 cm, Vol 138 ml,heterogeneous anteverted uterus,no measurable fibroids Endometrium          9.7 mm, symmetrical, wnl Right ovary             2.3 x 2 x 2.3 cm, wnl Left ovary                2.2 x 1.8 x 1.7 cm, wnl No free fluid Technician Comments: PELVIC US TA/TV:heterogeneous anteverted uterus,no measurable fibroids,normal ovaries bilat,ovaries appear mobile,EEC 9.30mm,1.5 cm complex nabothian cyst,no free fluid,no pain  during ultrasound [employer] 09/02/2017 11:38 AM Clinical Impression and recommendations: I have reviewed the sonogram results above, combined with the patient's current clinical course, below are my impressions and any appropriate recommendations for management based on the sonographic findings. Uterus is normal with normal endometrium, no abnormalities Ovaries are normal Lazaro Arms 09/02/2017 12:04 PM      MEDS ordered this encounter: No orders of the defined types were placed in this encounter.   Orders for this encounter: No orders of the defined types were placed in this encounter.   Impression: Menorrhagia with irregular cycle  Dysmenorrhea    Plan: Continue megestrol until surgery Hysteroscopy uterine curettage Minerva endometrial ablation 09/24/2017  Follow Up: Return in about 1 month (around 10/02/2017) for Post Op, with Dr Despina Hidden.       Face to face time:  10 minutes  Greater than 50% of the visit time was spent in counseling and coordination of care with the patient.  The summary and outline of the counseling and care coordination is summarized in the note above.   All questions were answered.  Past Medical History:  Diagnosis Date  . Asthma    COPD  .  Back pain   . Brain aneurysm    at [redacted]wks pregnant  . Headache(784.0)   . Hyperlipidemia    not on medication yet d/t MD not calling it in yet  . Hypertension    takes Triamertene and Aldomet daily  . Nocturia     Past Surgical History:  Procedure Laterality Date  . CESAREAN SECTION  2012  . INTRACRANIAL ANEURYSM REPAIR  06-16-2010  . LAPAROSCOPY  09/26/2010   Procedure: LAPAROSCOPY OPERATIVE;  Surgeon: Lazaro ArmsLuther H Helaine Yackel, MD;  Location: [facility];  Service: Gynecology;  Laterality: N/A;  Laparoscopic Bilateral Salpingectomy  . MULTIPLE EXTRACTIONS WITH ALVEOLOPLASTY  05/13/2011   Procedure: MULTIPLE EXTRACION WITH ALVEOLOPLASTY;  Surgeon: Georgia LopesScott M Jensen, DDS;  Location: [facility];  Service: Oral Surgery;   Laterality: Bilateral;  extraction teeth #'s 2,3,4,5,17,18,30,31, and 32 with alveoloplasty.  . TONSILLECTOMY     as a child  . TUBAL LIGATION      OB History    Gravida  5   Para  5   Term      Preterm      AB      Living  5     SAB      TAB      Ectopic      Multiple      Live Births              No Known Allergies  Social History   Socioeconomic History  . Marital status: Married    Spouse name: Not on file  . Number of children: Not on file  . Years of education: Not on file  . Highest education level: Not on file  Occupational History  . Not on file  Social Needs  . Financial resource strain: Not on file  . Food insecurity:    Worry: Not on file    Inability: Not on file  . Transportation needs:    Medical: Not on file    Non-medical: Not on file  Tobacco Use  . Smoking status: Current Every Day Smoker    Packs/day: 1.00    Years: 15.00    Pack years: 15.00    Types: Cigarettes  . Smokeless tobacco: Never Used  Substance and Sexual Activity  . Alcohol use: No  . Drug use: Yes    Frequency: 1.0 times per week    Types: Marijuana    Comment: occasional   . Sexual activity: Yes    Birth control/protection: Surgical    Comment: tubal  Lifestyle  . Physical activity:    Days per week: Not on file    Minutes per session: Not on file  . Stress: Not on file  Relationships  . Social connections:    Talks on phone: Not on file    Gets together: Not on file    Attends religious service: Not on file    Active member of club or organization: Not on file    Attends meetings of clubs or organizations: Not on file    Relationship status: Not on file  Other Topics Concern  . Not on file  Social History Narrative  . Not on file    Family History  Problem Relation Age of Onset  . Hypertension Father   . Heart disease Father   . Kidney disease Father   . Anesthesia problems Neg Hx   . Hypotension Neg Hx   . Malignant hyperthermia Neg  Hx   . Pseudochol deficiency Neg Hx

## 2017-09-16 NOTE — Patient Instructions (Signed)
[name]  09/16/2017     @PREFPERIOPPHARMACY @   Your procedure is scheduled on 09/24/2017.  Report to [facility] at 8:45 A.M.  Call this number if you have problems the morning of surgery:  [phone]   Remember:  Do not eat or drink after midnight.      Take these medicines the morning of surgery with A SIP OF WATER Megace, ProAir inhaler, Spiriva inhaler    Do not wear jewelry, make-up or nail polish.  Do not wear lotions, powders, or perfumes, or deodorant.  Do not shave 48 hours prior to surgery.  Men may shave face and neck.  Do not bring valuables to the hospital.  [facility] is not responsible for any belongings or valuables.  Contacts, dentures or bridgework may not be worn into surgery.  Leave your suitcase in the car.  After surgery it may be brought to your room.  For patients admitted to the hospital, discharge time will be determined by your treatment team.  Patients discharged the day of surgery will not be allowed to drive home.    Please read over the following fact sheets that you were given. Surgical Site Infection Prevention and Anesthesia Post-op Instructions     PATIENT INSTRUCTIONS POST-ANESTHESIA  IMMEDIATELY FOLLOWING SURGERY:  Do not drive or operate machinery for the first twenty four hours after surgery.  Do not make any important decisions for twenty four hours after surgery or while taking narcotic pain medications or sedatives.  If you develop intractable nausea and vomiting or a severe headache please notify your doctor immediately.  FOLLOW-UP:  Please make an appointment with your surgeon as instructed. You do not need to follow up with anesthesia unless specifically instructed to do so.  WOUND CARE INSTRUCTIONS (if applicable):  Keep a dry clean dressing on the anesthesia/puncture wound site if there is drainage.  Once the wound has quit draining you may leave it open to air.  Generally you should leave the bandage intact for  twenty four hours unless there is drainage.  If the epidural site drains for more than 36-48 hours please call the anesthesia department.  QUESTIONS?:  Please feel free to call your physician or the hospital operator if you have any questions, and they will be happy to assist you.      Hysteroscopy Hysteroscopy is a procedure used for looking inside the womb (uterus). It may be done for various reasons, including:  To evaluate abnormal bleeding, fibroid (benign, noncancerous) tumors, polyps, scar tissue (adhesions), and possibly cancer of the uterus.  To look for lumps (tumors) and other uterine growths.  To look for causes of why a woman cannot get pregnant (infertility), causes of recurrent loss of pregnancy (miscarriages), or a lost intrauterine device (IUD).  To perform a sterilization by blocking the fallopian tubes from inside the uterus.  In this procedure, a thin, flexible tube with a tiny light and camera on the end of it (hysteroscope) is used to look inside the uterus. A hysteroscopy should be done right after a menstrual period to be sure you are not pregnant. LET [facility] CARE PROVIDER KNOW ABOUT:  Any allergies you have.  All medicines you are taking, including vitamins, herbs, eye drops, creams, and over-the-counter medicines.  Previous problems you or members of your family have had with the use of anesthetics.  Any blood disorders you have.  Previous surgeries you have had.  Medical conditions you have. RISKS AND COMPLICATIONS Generally, this  is a safe procedure. However, as with any procedure, complications can occur. Possible complications include:  Putting a hole in the uterus.  Excessive bleeding.  Infection.  Damage to the cervix.  Injury to other organs.  Allergic reaction to medicines.  Too much fluid used in the uterus for the procedure.  BEFORE THE PROCEDURE  Ask your health care provider about changing or stopping any regular  medicines.  Do not take aspirin or blood thinners for 1 week before the procedure, or as directed by your health care provider. These can cause bleeding.  If you smoke, do not smoke for 2 weeks before the procedure.  In some cases, a medicine is placed in the cervix the day before the procedure. This medicine makes the cervix have a larger opening (dilate). This makes it easier for the instrument to be inserted into the uterus during the procedure.  Do not eat or drink anything for at least 8 hours before the surgery.  Arrange for someone to take you home after the procedure. PROCEDURE  You may be given a medicine to relax you (sedative). You may also be given one of the following: ? A medicine that numbs the area around the cervix (local anesthetic). ? A medicine that makes you sleep through the procedure (general anesthetic).  The hysteroscope is inserted through the vagina into the uterus. The camera on the hysteroscope sends a picture to a TV screen. This gives the surgeon a good view inside the uterus.  During the procedure, air or a liquid is put into the uterus, which allows the surgeon to see better.  Sometimes, tissue is gently scraped from inside the uterus. These tissue samples are sent to a lab for testing. What to expect after the procedure  If you had a general anesthetic, you may be groggy for a couple hours after the procedure.  If you had a local anesthetic, you will be able to go home as soon as you are stable and feel ready.  You may have some cramping. This normally lasts for a couple days.  You may have bleeding, which varies from light spotting for a few days to menstrual-like bleeding for 3-7 days. This is normal.  If your test results are not back during the visit, make an appointment with your health care provider to find out the results. This information is not intended to replace advice given to you by your health care provider. Make sure you discuss any  questions you have with your health care provider. Document Released: 06/03/2000 Document Revised: 08/03/2015 Document Reviewed: 09/24/2012 Elsevier Interactive Patient Education  2017 [employer] Dilation and Curettage or Vacuum Curettage Dilation and curettage (D&C) and vacuum curettage are minor procedures. A D&C involves stretching (dilation) the cervix and scraping (curettage) the inside lining of the uterus (endometrium). During a D&C, tissue is gently scraped from the endometrium, starting from the top portion of the uterus down to the lowest part of the uterus (cervix). During a vacuum curettage, the lining and tissue in the uterus are removed with the use of gentle suction. Curettage may be performed to either diagnose or treat a problem. As a diagnostic procedure, curettage is performed to examine tissues from the uterus. A diagnostic curettage may be done if you have:  Irregular bleeding in the uterus.  Bleeding with the development of clots.  Spotting between menstrual periods.  Prolonged menstrual periods or other abnormal bleeding.  Bleeding after menopause.  No menstrual period (amenorrhea).  A change  in size and shape of the uterus.  Abnormal endometrial cells discovered during a Pap test.  As a treatment procedure, curettage may be performed for the following reasons:  Removal of an IUD (intrauterine device).  Removal of retained placenta after giving birth.  Abortion.  Miscarriage.  Removal of endometrial polyps.  Removal of uncommon types of noncancerous lumps (fibroids).  Tell a health care provider about:  Any allergies you have, including allergies to prescribed medicine or latex.  All medicines you are taking, including vitamins, herbs, eye drops, creams, and over-the-counter medicines. This is especially important if you take any blood-thinning medicine. Bring a list of all of your medicines to your appointment.  Any problems you or family members  have had with anesthetic medicines.  Any blood disorders you have.  Any surgeries you have had.  Your medical history and any medical conditions you have.  Whether you are pregnant or may be pregnant.  Recent vaginal infections you have had.  Recent menstrual periods, bleeding problems you have had, and what form of birth control (contraception) you use. What are the risks? Generally, this is a safe procedure. However, problems may occur, including:  Infection.  Heavy vaginal bleeding.  Allergic reactions to medicines.  Damage to the cervix or other structures or organs.  Development of scar tissue (adhesions) inside the uterus, which can cause abnormal amounts of menstrual bleeding. This may make it harder to get pregnant in the future.  A hole (perforation) or puncture in the uterine wall. This is rare.  What happens before the procedure? Staying hydrated Follow instructions from your health care provider about hydration, which may include:  Up to 2 hours before the procedure - you may continue to drink clear liquids, such as water, clear fruit juice, black coffee, and plain tea.  Eating and drinking restrictions Follow instructions from your health care provider about eating and drinking, which may include:  8 hours before the procedure - stop eating heavy meals or foods such as meat, fried foods, or fatty foods.  6 hours before the procedure - stop eating light meals or foods, such as toast or cereal.  6 hours before the procedure - stop drinking milk or drinks that contain milk.  2 hours before the procedure - stop drinking clear liquids. If your health care provider told you to take your medicine(s) on the day of your procedure, take them with only a sip of water.  Medicines  Ask your health care provider about: ? Changing or stopping your regular medicines. This is especially important if you are taking diabetes medicines or blood thinners. ? Taking medicines  such as aspirin and ibuprofen. These medicines can thin your blood. Do not take these medicines before your procedure if your health care provider instructs you not to.  You may be given antibiotic medicine to help prevent infection. General instructions  For 24 hours before your procedure, do not: ? Douche. ? Use tampons. ? Use medicines, creams, or suppositories in the vagina. ? Have sexual intercourse.  You may be given a pregnancy test on the day of the procedure.  Plan to have someone take you home from the hospital or clinic.  You may have a blood or urine sample taken.  If you will be going home right after the procedure, plan to have someone with you for 24 hours. What happens during the procedure?  To reduce your risk of infection: ? Your health care team will wash or sanitize their hands. ?  Your skin will be washed with soap.  An IV tube will be inserted into one of your veins.  You will be given one of the following: ? A medicine that numbs the area in and around the cervix (local anesthetic). ? A medicine to make you fall asleep (general anesthetic).  You will lie down on your back, with your feet in foot rests (stirrups).  The size and position of your uterus will be checked.  A lubricated instrument (speculum or Sims retractor) will be inserted into the back side of your vagina. The speculum will be used to hold apart the walls of your vagina so your health care provider can see your cervix.  A tool (tenaculum) will be attached to the lip of the cervix to stabilize it.  Your cervix will be softened and dilated. This may be done by: ? Taking a medicine. ? Having tapered dilators or thin rods (laminaria) or gradual widening instruments (tapered dilators) inserted into your cervix.  A small, sharp, curved instrument (curette) will be used to scrape a small amount of tissue or cells from the endometrium or cervical canal. In some cases, gentle suction is applied  with the curette. The curette will then be removed. The cells will be taken to a lab for testing. The procedure may vary among health care providers and hospitals. What happens after the procedure?  You may have mild cramping, backache, pain, and light bleeding or spotting. You may pass small blood clots from your vagina.  You may have to wear compression stockings. These stockings help to prevent blood clots and reduce swelling in your legs.  Your blood pressure, heart rate, breathing rate, and blood oxygen level will be monitored until the medicines you were given have worn off. Summary  Dilation and curettage (D&C) involves stretching (dilation) the cervix and scraping (curettage) the inside lining of the uterus (endometrium).  After the procedure, you may have mild cramping, backache, pain, and light bleeding or spotting. You may pass small blood clots from your vagina.  Plan to have someone take you home from the hospital or clinic. This information is not intended to replace advice given to you by your health care provider. Make sure you discuss any questions you have with your health care provider. Document Released: 02/25/2005 Document Revised: 11/12/2015 Document Reviewed: 11/12/2015 Elsevier Interactive Patient Education  2018 [employer]

## 2017-09-17 ENCOUNTER — Encounter (HOSPITAL_COMMUNITY): Payer: Self-pay

## 2017-09-17 ENCOUNTER — Other Ambulatory Visit: Payer: Self-pay

## 2017-09-17 ENCOUNTER — Encounter (HOSPITAL_COMMUNITY)
Admission: RE | Admit: 2017-09-17 | Discharge: 2017-09-17 | Disposition: A | Discharge status: Home / Self Care | Payer: Medicare Other | Source: Ambulatory Visit | Attending: Obstetrics & Gynecology | Admitting: Obstetrics & Gynecology

## 2017-09-17 ENCOUNTER — Other Ambulatory Visit: Payer: Self-pay | Admitting: Obstetrics & Gynecology

## 2017-09-17 DIAGNOSIS — J449 Chronic obstructive pulmonary disease, unspecified: Secondary | ICD-10-CM | POA: Insufficient documentation

## 2017-09-17 DIAGNOSIS — Z01812 Encounter for preprocedural laboratory examination: Secondary | ICD-10-CM | POA: Insufficient documentation

## 2017-09-17 DIAGNOSIS — I10 Essential (primary) hypertension: Secondary | ICD-10-CM | POA: Insufficient documentation

## 2017-09-17 DIAGNOSIS — R51 Headache: Secondary | ICD-10-CM | POA: Diagnosis not present

## 2017-09-17 DIAGNOSIS — E785 Hyperlipidemia, unspecified: Secondary | ICD-10-CM | POA: Diagnosis not present

## 2017-09-17 DIAGNOSIS — R351 Nocturia: Secondary | ICD-10-CM | POA: Diagnosis not present

## 2017-09-17 DIAGNOSIS — I671 Cerebral aneurysm, nonruptured: Secondary | ICD-10-CM | POA: Insufficient documentation

## 2017-09-17 LAB — URINALYSIS, ROUTINE W REFLEX MICROSCOPIC
BILIRUBIN URINE: NEGATIVE
Glucose, UA: NEGATIVE mg/dL
HGB URINE DIPSTICK: NEGATIVE
KETONES UR: NEGATIVE mg/dL
Leukocytes, UA: NEGATIVE
NITRITE: NEGATIVE
Protein, ur: NEGATIVE mg/dL
Specific Gravity, Urine: 1.028 (ref 1.005–1.030)
pH: 5 (ref 5.0–8.0)

## 2017-09-17 LAB — HCG, QUANTITATIVE, PREGNANCY: hCG, Beta Chain, Quant, S: <1 m[IU]/mL (ref <5)

## 2017-09-17 LAB — CBC
HCT: 41.2 % (ref 36.0–46.0)
Hemoglobin: 13.8 g/dL (ref 12.0–15.0)
MCH: 27.9 pg (ref 26.0–34.0)
MCHC: 33.5 g/dL (ref 30.0–36.0)
MCV: 83.4 fL (ref 78.0–100.0)
PLATELETS: 244 10*3/uL (ref 150–400)
RBC: 4.94 MIL/uL (ref 3.87–5.11)
RDW: 13.5 % (ref 11.5–15.5)
WBC: 7.7 10*3/uL (ref 4.0–10.5)

## 2017-09-17 LAB — RAPID HIV SCREEN (HIV 1/2 AB+AG)
HIV 1/2 ANTIBODIES: NONREACTIVE
HIV-1 P24 ANTIGEN - HIV24: NONREACTIVE

## 2017-09-17 LAB — COMPREHENSIVE METABOLIC PANEL
ALT: 12 U/L (ref 0–44)
ANION GAP: 9 (ref 5–15)
AST: 12 U/L — ABNORMAL LOW (ref 15–41)
Albumin: 4.3 g/dL (ref 3.5–5.0)
Alkaline Phosphatase: 53 U/L (ref 38–126)
BUN: 15 mg/dL (ref 6–20)
CHLORIDE: 108 mmol/L (ref 98–111)
CO2: 21 mmol/L — ABNORMAL LOW (ref 22–32)
Calcium: 9.6 mg/dL (ref 8.9–10.3)
Creatinine, Ser: 0.91 mg/dL (ref 0.44–1.00)
GFR calc non Af Amer: >60 mL/min (ref >60)
Glucose, Bld: 85 mg/dL (ref 70–99)
POTASSIUM: 4 mmol/L (ref 3.5–5.1)
Sodium: 138 mmol/L (ref 135–145)
Total Bilirubin: 0.9 mg/dL (ref 0.3–1.2)
Total Protein: 8.1 g/dL (ref 6.5–8.1)

## 2017-09-19 ENCOUNTER — Other Ambulatory Visit: Payer: Self-pay

## 2017-09-19 ENCOUNTER — Emergency Department (HOSPITAL_COMMUNITY)
Admission: EM | Admit: 2017-09-19 | Discharge: 2017-09-19 | Disposition: A | Discharge status: Home / Self Care | Payer: Medicare Other | Attending: Emergency Medicine | Admitting: Emergency Medicine

## 2017-09-19 ENCOUNTER — Encounter (HOSPITAL_COMMUNITY): Payer: Self-pay | Admitting: Emergency Medicine

## 2017-09-19 DIAGNOSIS — K029 Dental caries, unspecified: Secondary | ICD-10-CM | POA: Diagnosis not present

## 2017-09-19 DIAGNOSIS — K047 Periapical abscess without sinus: Secondary | ICD-10-CM | POA: Insufficient documentation

## 2017-09-19 DIAGNOSIS — R220 Localized swelling, mass and lump, head: Secondary | ICD-10-CM

## 2017-09-19 DIAGNOSIS — I10 Essential (primary) hypertension: Secondary | ICD-10-CM | POA: Insufficient documentation

## 2017-09-19 DIAGNOSIS — Z79899 Other long term (current) drug therapy: Secondary | ICD-10-CM | POA: Insufficient documentation

## 2017-09-19 DIAGNOSIS — R600 Localized edema: Secondary | ICD-10-CM | POA: Insufficient documentation

## 2017-09-19 DIAGNOSIS — F17210 Nicotine dependence, cigarettes, uncomplicated: Secondary | ICD-10-CM | POA: Diagnosis not present

## 2017-09-19 MED ORDER — CLINDAMYCIN HCL 150 MG PO CAPS: 450.0000 mg | ORAL_CAPSULE | Freq: Three times a day (TID) | ORAL | 0 refills | Status: DC

## 2017-09-19 MED ORDER — CLINDAMYCIN HCL 150 MG PO CAPS: 450.0000 mg | ORAL_CAPSULE | Freq: Three times a day (TID) | ORAL | 0 refills | Status: AC

## 2017-09-19 NOTE — ED Provider Notes (Signed)
[facility] Provider Note   CSN: [id number] Arrival date & time: 09/19/17  1022     History   Chief Complaint Chief Complaint  Patient presents with  . Facial Swelling    HPI [name] is a 47 y.o. female.  47 y.o female smoker with a PMH presents to the ED complaining of left facial swelling x 1 day. Patient states the pain began a couple of days ago but she noticed the swelling yesterday morning. Patient woke up and noticed the swelling had gotten worse. She states she needs two teeth pulled on the left, and has made an appointment but cannot get in til next week.She denies any difficulty swallowing, shortness of breath, voice changes, or chest pain.      Past Medical History:  Diagnosis Date  . Asthma    COPD  . Back pain   . Brain aneurysm    at [redacted]wks pregnant  . Headache(784.0)   . Hyperlipidemia    not on medication yet d/t MD not calling it in yet  . Hypertension    takes Triamertene and Aldomet daily  . Nocturia     Patient Active Problem List   Diagnosis Date Noted  . Dysmenorrhea 07/21/2017  . Menorrhagia with irregular cycle 07/21/2017  . Screening for colorectal cancer 07/21/2017  . Routine cervical smear 07/21/2017  . Encounter for gynecological examination with Papanicolaou smear of cervix 07/21/2017  . SUI (stress urinary incontinence, female) 07/21/2017  . Acute respiratory distress 02/03/2015  . Tobacco use disorder 02/03/2015  . Essential hypertension 02/03/2015  . Bladder spasm 02/03/2015  . Community acquired pneumonia   . Pneumonia 02/02/2015  . Difficulty in walking(719.7) 03/13/2012  . Bilateral leg weakness 03/13/2012    Past Surgical History:  Procedure Laterality Date  . CESAREAN SECTION  2012  . INTRACRANIAL ANEURYSM REPAIR  06-16-2010  . LAPAROSCOPY  09/26/2010   Procedure: LAPAROSCOPY OPERATIVE;  Surgeon: Lazaro ArmsLuther H Eure, MD;  Location: [facility];  Service: Gynecology;  Laterality: N/A;  Laparoscopic  Bilateral Salpingectomy  . MULTIPLE EXTRACTIONS WITH ALVEOLOPLASTY  05/13/2011   Procedure: MULTIPLE EXTRACION WITH ALVEOLOPLASTY;  Surgeon: Georgia LopesScott M Jensen, DDS;  Location: [facility] OR;  Service: Oral Surgery;  Laterality: Bilateral;  extraction teeth #'s 2,3,4,5,17,18,30,31, and 32 with alveoloplasty.  . TONSILLECTOMY     as a child  . TUBAL LIGATION       OB History    Gravida  5   Para  5   Term      Preterm      AB      Living  5     SAB      TAB      Ectopic      Multiple      Live Births               Home Medications    Prior to Admission medications   Medication Sig Start Date End Date Taking? Authorizing Provider  ibuprofen (ADVIL,MOTRIN) 200 MG tablet Take 600-800 mg by mouth 3 (three) times daily as needed for headache or moderate pain.   Yes [provider]  lisinopril-hydrochlorothiazide (PRINZIDE,ZESTORETIC) 10-12.5 MG tablet Take 1 tablet by mouth daily.   Yes [provider]  megestrol (MEGACE) 40 MG tablet 3 tablets a day for 5 days, 2 tablets a day for 5 days then 1 tablet daily Patient taking differently: Take 40 mg by mouth daily.  08/05/17  Yes Lazaro ArmsEure, Luther H, MD  Melatonin 5 MG TBDP Take 5 mg by mouth at bedtime as needed (sleep).   Yes [provider]  PROAIR HFA 108 (90 BASE) MCG/ACT inhaler use as directed Patient taking differently: INHALE TWO PUFFS EVERY 6 HOURS AS NEEDED FOR SHORTNESS OF BREATH AND/OR WHEEZING 08/06/13  Yes Lazaro Arms, MD  SPIRIVA HANDIHALER 18 MCG inhalation capsule Place 18 mcg into inhaler and inhale daily.  07/14/14  Yes [provider]    Family History Family History  Problem Relation Age of Onset  . Hypertension Father   . Heart disease Father   . Kidney disease Father   . Anesthesia problems Neg Hx   . Hypotension Neg Hx   . Malignant hyperthermia Neg Hx   . Pseudochol deficiency Neg Hx     Social History Social History   Tobacco Use  . Smoking status: Current Every Day  Smoker    Packs/day: 1.00    Years: 15.00    Pack years: 15.00    Types: Cigarettes  . Smokeless tobacco: Never Used  Substance Use Topics  . Alcohol use: No  . Drug use: Yes    Frequency: 1.0 times per week    Types: Marijuana    Comment: occasional      Allergies   Patient has no known allergies.   Review of Systems Review of Systems  Constitutional: Negative for fever.  HENT: Positive for dental problem and facial swelling. Negative for sore throat, trouble swallowing and voice change.   Respiratory: Negative for chest tightness and shortness of breath.   Cardiovascular: Negative for chest pain and palpitations.  Gastrointestinal: Negative for abdominal pain, diarrhea, nausea and vomiting.  Genitourinary: Negative for dysuria and flank pain.  Musculoskeletal: Negative for back pain.  Neurological: Negative for speech difficulty and headaches.  All other systems reviewed and are negative.    Physical Exam Updated Vital Signs BP 125/86 (BP Location: Left Arm)   Pulse 78   Temp 98.4 F (36.9 C) (Oral)   Resp 16   Ht 5\' 5"  (1.651 m)   Wt 81.6 kg (180 lb)   LMP 07/09/2017 (Approximate)   SpO2 98%   BMI 29.95 kg/m   Physical Exam  Constitutional: She is oriented to person, place, and time. She appears well-developed and well-nourished.  HENT:  Head: Normocephalic and atraumatic.  Mouth/Throat: Uvula is midline, oropharynx is clear and moist and mucous membranes are normal. No trismus in the jaw. Abnormal dentition. Dental caries present. No dental abscesses or uvula swelling. No oropharyngeal exudate or tonsillar abscesses.    Poor dentition seen on exam,along with dental caries. Patient has broken off teeth on left side.Facial swelling. ANOG seen on left side of missing teeth.    Eyes: Pupils are equal, round, and reactive to light.  Neck: Trachea normal and phonation normal. No tracheal deviation present.  Pulmonary/Chest: Effort normal and breath sounds  normal. No stridor. She has no wheezes.  Abdominal: Soft. Bowel sounds are normal. She exhibits no distension. There is no tenderness.  Musculoskeletal: She exhibits edema and tenderness. She exhibits no deformity.  Neurological: She is alert and oriented to person, place, and time.     ED Treatments / Results  Labs (all labs ordered are listed, but only abnormal results are displayed) Labs Reviewed - No data to display  EKG None  Radiology No results found.  Procedures Procedures (including critical care time)  Medications Ordered in ED Medications - No data to display   Initial Impression /  Assessment and Plan / ED Course  I have reviewed the triage vital signs and the nursing notes.  Pertinent labs & imaging results that were available during my care of the patient were reviewed by me and considered in my medical decision making (see chart for details).     Patient presents to the ED with facial swelling x 5 days. Patient states she needs teeth pulled and has not been able to see a dentist. Patient states the swelling began yesterday and has increase this morning. Patient is not having any difficulty breathing, swallowing, eating or had changes in voice.  At this time patient has no airway compromise. Patient has an appoitment with the dentist in a week. No visual oral abcess was seen on exam. I have advised patient that we will treat infection with oral antibiotics and to follow up with dentist as soon as possible.   Final Clinical Impressions(s) / ED Diagnoses   Final diagnoses:  Facial swelling  Dental infection  Left facial swelling    ED Discharge Orders    None       Freddy Jaksch 09/19/17 1119    Eber Hong, MD 09/19/17 1122

## 2017-09-19 NOTE — Discharge Instructions (Addendum)
I have prescribed antibiotics for infection, please take 3 capsules 3 times a day for the next 10 days. Please follow up with your dentist as soon as possible. Please return to the ED if you experience any fever, trouble swallowing, or changes in your voice.

## 2017-09-19 NOTE — ED Triage Notes (Signed)
Pt c/o LT sided facial swelling that radiates from lower jaw into neck that began yesterday. States pain is coming from two teeth in lower jaw. Denies difficulty swallowing.

## 2017-09-24 ENCOUNTER — Other Ambulatory Visit: Payer: Self-pay

## 2017-09-24 ENCOUNTER — Encounter (HOSPITAL_COMMUNITY)
Admission: RE | Disposition: A | Discharge status: Home / Self Care | Payer: Self-pay | Source: Ambulatory Visit | Attending: Obstetrics & Gynecology

## 2017-09-24 ENCOUNTER — Encounter (HOSPITAL_COMMUNITY): Payer: Self-pay | Admitting: *Deleted

## 2017-09-24 ENCOUNTER — Ambulatory Visit (HOSPITAL_COMMUNITY): Payer: Medicare Other | Admitting: Anesthesiology

## 2017-09-24 ENCOUNTER — Ambulatory Visit (HOSPITAL_COMMUNITY)
Admission: RE | Admit: 2017-09-24 | Discharge: 2017-09-24 | Disposition: A | Discharge status: Home / Self Care | Payer: Medicare Other | Source: Ambulatory Visit | Attending: Obstetrics & Gynecology | Admitting: Obstetrics & Gynecology

## 2017-09-24 DIAGNOSIS — N921 Excessive and frequent menstruation with irregular cycle: Secondary | ICD-10-CM | POA: Insufficient documentation

## 2017-09-24 DIAGNOSIS — Z79899 Other long term (current) drug therapy: Secondary | ICD-10-CM | POA: Diagnosis not present

## 2017-09-24 DIAGNOSIS — E785 Hyperlipidemia, unspecified: Secondary | ICD-10-CM | POA: Diagnosis not present

## 2017-09-24 DIAGNOSIS — I739 Peripheral vascular disease, unspecified: Secondary | ICD-10-CM | POA: Diagnosis not present

## 2017-09-24 DIAGNOSIS — N946 Dysmenorrhea, unspecified: Secondary | ICD-10-CM | POA: Insufficient documentation

## 2017-09-24 DIAGNOSIS — F17210 Nicotine dependence, cigarettes, uncomplicated: Secondary | ICD-10-CM | POA: Diagnosis not present

## 2017-09-24 DIAGNOSIS — N920 Excessive and frequent menstruation with regular cycle: Secondary | ICD-10-CM | POA: Diagnosis not present

## 2017-09-24 DIAGNOSIS — J449 Chronic obstructive pulmonary disease, unspecified: Secondary | ICD-10-CM | POA: Diagnosis not present

## 2017-09-24 DIAGNOSIS — I10 Essential (primary) hypertension: Secondary | ICD-10-CM | POA: Insufficient documentation

## 2017-09-24 HISTORY — PX: DILITATION & CURRETTAGE/HYSTROSCOPY WITH NOVASURE ABLATION: SHX5568

## 2017-09-24 SURGERY — DILATATION & CURETTAGE/HYSTEROSCOPY WITH NOVASURE ABLATION
Anesthesia: General | Site: Uterus

## 2017-09-24 MED ORDER — PROPOFOL 10 MG/ML IV BOLUS
INTRAVENOUS | Status: DC | PRN
  Administered 2017-09-24: 150 mg via INTRAVENOUS

## 2017-09-24 MED ORDER — SODIUM CHLORIDE 0.9 % IR SOLN
Status: DC | PRN
  Administered 2017-09-24: 3000 mL

## 2017-09-24 MED ORDER — PHENYLEPHRINE HCL 10 MG/ML IJ SOLN
INTRAMUSCULAR | Status: DC | PRN
  Administered 2017-09-24: 60 ug via INTRAVENOUS

## 2017-09-24 MED ORDER — MIDAZOLAM HCL 5 MG/5ML IJ SOLN
INTRAMUSCULAR | Status: DC | PRN
  Administered 2017-09-24: 2 mg via INTRAVENOUS

## 2017-09-24 MED ORDER — FENTANYL CITRATE (PF) 100 MCG/2ML IJ SOLN
INTRAMUSCULAR | Status: DC | PRN
  Administered 2017-09-24 (×3): 50 ug via INTRAVENOUS

## 2017-09-24 MED ORDER — 0.9 % SODIUM CHLORIDE (POUR BTL) OPTIME
TOPICAL | Status: DC | PRN
  Administered 2017-09-24: 1000 mL

## 2017-09-24 MED ORDER — FENTANYL CITRATE (PF) 100 MCG/2ML IJ SOLN
25.0000 ug | INTRAMUSCULAR | Status: DC | PRN
  Administered 2017-09-24 (×3): 50 ug via INTRAVENOUS

## 2017-09-24 MED ORDER — LACTATED RINGERS IV SOLN
INTRAVENOUS | Status: DC
  Administered 2017-09-24: 14:00:00 via INTRAVENOUS

## 2017-09-24 MED ORDER — HYDROCODONE-ACETAMINOPHEN 7.5-325 MG PO TABS
1.0000 | ORAL_TABLET | Freq: Once | ORAL | Status: DC | PRN
  Filled 2017-09-24: qty 1

## 2017-09-24 MED ORDER — MIDAZOLAM HCL 2 MG/2ML IJ SOLN
INTRAMUSCULAR | Status: AC
Start: 2017-09-24 — End: ?
  Filled 2017-09-24: qty 2

## 2017-09-24 MED ORDER — ROCURONIUM 10MG/ML (10ML) SYRINGE FOR MEDFUSION PUMP - OPTIME
INTRAVENOUS | Status: DC | PRN
  Administered 2017-09-24: 5 mg via INTRAVENOUS

## 2017-09-24 MED ORDER — ALBUTEROL SULFATE (2.5 MG/3ML) 0.083% IN NEBU
INHALATION_SOLUTION | RESPIRATORY_TRACT | Status: AC
  Filled 2017-09-24: qty 3

## 2017-09-24 MED ORDER — MIDAZOLAM HCL 2 MG/2ML IJ SOLN
INTRAMUSCULAR | Status: AC
  Filled 2017-09-24: qty 2

## 2017-09-24 MED ORDER — HYDROCODONE-ACETAMINOPHEN 7.5-325 MG PO TABS
1.0000 | ORAL_TABLET | Freq: Once | ORAL | Status: AC | PRN
  Administered 2017-09-24: 1 via ORAL

## 2017-09-24 MED ORDER — SUCCINYLCHOLINE 20MG/ML (10ML) SYRINGE FOR MEDFUSION PUMP - OPTIME
INTRAMUSCULAR | Status: DC | PRN
  Administered 2017-09-24: 120 mg via INTRAVENOUS

## 2017-09-24 MED ORDER — FENTANYL CITRATE (PF) 100 MCG/2ML IJ SOLN
25.0000 ug | INTRAMUSCULAR | Status: DC | PRN
  Filled 2017-09-24 (×2): qty 2

## 2017-09-24 MED ORDER — FENTANYL CITRATE (PF) 250 MCG/5ML IJ SOLN
INTRAMUSCULAR | Status: AC
  Filled 2017-09-24: qty 5

## 2017-09-24 MED ORDER — LACTATED RINGERS IV SOLN
INTRAVENOUS | Status: DC
  Administered 2017-09-24: 12:00:00 via INTRAVENOUS

## 2017-09-24 MED ORDER — MIDAZOLAM HCL 2 MG/2ML IJ SOLN
1.0000 mg | INTRAMUSCULAR | Status: DC | PRN
  Administered 2017-09-24: 1 mg via INTRAVENOUS

## 2017-09-24 MED ORDER — ALBUTEROL SULFATE (2.5 MG/3ML) 0.083% IN NEBU
2.5000 mg | INHALATION_SOLUTION | Freq: Four times a day (QID) | RESPIRATORY_TRACT | Status: DC | PRN
  Administered 2017-09-24: 2.5 mg via RESPIRATORY_TRACT

## 2017-09-24 MED ORDER — KETOROLAC TROMETHAMINE 10 MG PO TABS: 10.0000 mg | ORAL_TABLET | Freq: Three times a day (TID) | ORAL | 0 refills | Status: DC | PRN

## 2017-09-24 MED ORDER — PROMETHAZINE HCL 25 MG PO TABS: 25.0000 mg | ORAL_TABLET | Freq: Four times a day (QID) | ORAL | 1 refills | Status: DC | PRN

## 2017-09-24 MED ORDER — HYDROCODONE-ACETAMINOPHEN 5-325 MG PO TABS: 1.0000 | ORAL_TABLET | Freq: Four times a day (QID) | ORAL | 0 refills | Status: DC | PRN

## 2017-09-24 MED ORDER — ONDANSETRON HCL 4 MG/2ML IJ SOLN
INTRAMUSCULAR | Status: DC | PRN
  Administered 2017-09-24: 4 mg via INTRAVENOUS

## 2017-09-24 MED ORDER — ONDANSETRON HCL 4 MG/2ML IJ SOLN
INTRAMUSCULAR | Status: AC
  Filled 2017-09-24: qty 2

## 2017-09-24 MED ORDER — KETOROLAC TROMETHAMINE 30 MG/ML IJ SOLN
30.0000 mg | Freq: Once | INTRAMUSCULAR | Status: AC
  Administered 2017-09-24: 30 mg via INTRAVENOUS
  Filled 2017-09-24: qty 1

## 2017-09-24 MED ORDER — CEFAZOLIN SODIUM-DEXTROSE 2-4 GM/100ML-% IV SOLN
2.0000 g | INTRAVENOUS | Status: AC
  Administered 2017-09-24: 2 g via INTRAVENOUS
  Filled 2017-09-24: qty 100

## 2017-09-24 MED ORDER — LIDOCAINE HCL (CARDIAC) PF 50 MG/5ML IV SOSY
PREFILLED_SYRINGE | INTRAVENOUS | Status: DC | PRN
  Administered 2017-09-24: 50 mg via INTRAVENOUS

## 2017-09-24 SURGICAL SUPPLY — 29 items
CLOTH BEACON ORANGE TIMEOUT ST (SAFETY) ×3 IMPLANT
COVER LIGHT HANDLE STERIS (MISCELLANEOUS) ×6 IMPLANT
GAUZE SPONGE 4X4 12PLY STRL (GAUZE/BANDAGES/DRESSINGS) ×3 IMPLANT
GAUZE SPONGE 4X4 16PLY XRAY LF (GAUZE/BANDAGES/DRESSINGS) ×3 IMPLANT
GLOVE BIO SURGEON STRL SZ7 (GLOVE) ×4 IMPLANT
GLOVE BIOGEL PI IND STRL 7.0 (GLOVE) ×2 IMPLANT
GLOVE BIOGEL PI IND STRL 8 (GLOVE) ×1 IMPLANT
GLOVE BIOGEL PI INDICATOR 7.0 (GLOVE) ×4
GLOVE BIOGEL PI INDICATOR 8 (GLOVE) ×2
GLOVE ECLIPSE 8.0 STRL XLNG CF (GLOVE) ×3 IMPLANT
GOWN STRL REUS W/TWL LRG LVL3 (GOWN DISPOSABLE) ×3 IMPLANT
GOWN STRL REUS W/TWL XL LVL3 (GOWN DISPOSABLE) ×3 IMPLANT
HANDPIECE ABLA MINERVA ENDO (MISCELLANEOUS) ×3 IMPLANT
INST SET HYSTEROSCOPY (KITS) ×3 IMPLANT
IV NS 1000ML (IV SOLUTION) ×3
IV NS 1000ML BAXH (IV SOLUTION) ×1 IMPLANT
IV NS IRRIG 3000ML ARTHROMATIC (IV SOLUTION) ×3 IMPLANT
KIT TURNOVER CYSTO (KITS) ×3 IMPLANT
MANIFOLD NEPTUNE II (INSTRUMENTS) ×3 IMPLANT
MARKER SKIN DUAL TIP RULER LAB (MISCELLANEOUS) ×3 IMPLANT
NS IRRIG 1000ML POUR BTL (IV SOLUTION) ×3 IMPLANT
PACK BASIC III (CUSTOM PROCEDURE TRAY) ×3
PACK SRG BSC III STRL LF ECLPS (CUSTOM PROCEDURE TRAY) ×1 IMPLANT
PAD ARMBOARD 7.5X6 YLW CONV (MISCELLANEOUS) ×3 IMPLANT
PAD TELFA 3X4 1S STER (GAUZE/BANDAGES/DRESSINGS) ×3 IMPLANT
SET BASIN LINEN APH (SET/KITS/TRAYS/PACK) ×3 IMPLANT
SET IRRIG Y TYPE TUR BLADDER L (SET/KITS/TRAYS/PACK) ×3 IMPLANT
SHEET LAVH (DRAPES) ×3 IMPLANT
YANKAUER SUCT BULB TIP 10FT TU (MISCELLANEOUS) ×3 IMPLANT

## 2017-09-24 NOTE — Discharge Instructions (Signed)
Endometrial Ablation Endometrial ablation is a procedure that destroys the thin inner layer of the lining of the uterus (endometrium). This procedure may be done:  To stop heavy periods.  To stop bleeding that is causing anemia.  To control irregular bleeding.  To treat bleeding caused by small tumors (fibroids) in the endometrium.  This procedure is often an alternative to major surgery, such as removal of the uterus and cervix (hysterectomy). As a result of this procedure:  You may not be able to have children. However, if you are premenopausal (you have not gone through menopause): ? You may still have a small chance of getting pregnant. ? You will need to use a reliable method of birth control after the procedure to prevent pregnancy.  You may stop having a menstrual period, or you may have only a small amount of bleeding during your period. Menstruation may return several years after the procedure.  Tell a health care provider about:  Any allergies you have.  All medicines you are taking, including vitamins, herbs, eye drops, creams, and over-the-counter medicines.  Any problems you or family members have had with the use of anesthetic medicines.  Any blood disorders you have.  Any surgeries you have had.  Any medical conditions you have. What are the risks? Generally, this is a safe procedure. However, problems may occur, including:  A hole (perforation) in the uterus or bowel.  Infection of the uterus, bladder, or vagina.  Bleeding.  Damage to other structures or organs.  An air bubble in the lung (air embolus).  Problems with pregnancy after the procedure.  Failure of the procedure.  Decreased ability to diagnose cancer in the endometrium.  What happens before the procedure?  You will have tests of your endometrium to make sure there are no pre-cancerous cells or cancer cells present.  You may have an ultrasound of the uterus.  You may be given  medicines to thin the endometrium.  Ask your health care provider about: ? Changing or stopping your regular medicines. This is especially important if you take diabetes medicines or blood thinners. ? Taking medicines such as aspirin and ibuprofen. These medicines can thin your blood. Do not take these medicines before your procedure if your doctor tells you not to.  Plan to have someone take you home from the hospital or clinic. What happens during the procedure?  You will lie on an exam table with your feet and legs supported as in a pelvic exam.  To lower your risk of infection: ? Your health care team will wash or sanitize their hands and put on germ-free (sterile) gloves. ? Your genital area will be washed with soap.  An IV tube will be inserted into one of your veins.  You will be given a medicine to help you relax (sedative).  A surgical instrument with a light and camera (resectoscope) will be inserted into your vagina and moved into your uterus. This allows your surgeon to see inside your uterus.  Endometrial tissue will be removed using one of the following methods: ? Radiofrequency. This method uses a radiofrequency-alternating electric current to remove the endometrium. ? Cryotherapy. This method uses extreme cold to freeze the endometrium. ? Heated-free liquid. This method uses a heated saltwater (saline) solution to remove the endometrium. ? Microwave. This method uses high-energy microwaves to heat up the endometrium and remove it. ? Thermal balloon. This method involves inserting a catheter with a balloon tip into the uterus. The balloon tip is   filled with heated fluid to remove the endometrium. The procedure may vary among health care providers and hospitals. What happens after the procedure?  Your blood pressure, heart rate, breathing rate, and blood oxygen level will be monitored until the medicines you were given have worn off.  As tissue healing occurs, you may  notice vaginal bleeding for 4-6 weeks after the procedure. You may also experience: ? Cramps. ? Thin, watery vaginal discharge that is light pink or brown in color. ? A need to urinate more frequently than usual. ? Nausea.  Do not drive for 24 hours if you were given a sedative.  Do not have sex or insert anything into your vagina until your health care provider approves. Summary  Endometrial ablation is done to treat the many causes of heavy menstrual bleeding.  The procedure may be done only after medications have been tried to control the bleeding.  Plan to have someone take you home from the hospital or clinic. This information is not intended to replace advice given to you by your health care provider. Make sure you discuss any questions you have with your health care provider. Document Released: 01/05/2004 Document Revised: 03/14/2016 Document Reviewed: 03/14/2016 Elsevier Interactive Patient Education  2017 [employer]  

## 2017-09-24 NOTE — H&P (Signed)
Preoperative History and Physical  [name] is a 47 y.o. G5P5 with No LMP recorded. admitted for a hysteroscopy uterine curettage endometrial ablation.  Has had severe periods for 2-3 years soils clothes and sheets, sonogram is normal  PMH:    Past Medical History:  Diagnosis Date  . Asthma    COPD  . Back pain   . Brain aneurysm    at [redacted]wks pregnant  . Headache(784.0)   . Hyperlipidemia    not on medication yet d/t MD not calling it in yet  . Hypertension    takes Triamertene and Aldomet daily  . Nocturia     PSH:     Past Surgical History:  Procedure Laterality Date  . CESAREAN SECTION  2012  . INTRACRANIAL ANEURYSM REPAIR  06-16-2010  . LAPAROSCOPY  09/26/2010   Procedure: LAPAROSCOPY OPERATIVE;  Surgeon: Lazaro Arms, MD;  Location: [facility];  Service: Gynecology;  Laterality: N/A;  Laparoscopic Bilateral Salpingectomy  . MULTIPLE EXTRACTIONS WITH ALVEOLOPLASTY  05/13/2011   Procedure: MULTIPLE EXTRACION WITH ALVEOLOPLASTY;  Surgeon: Georgia Lopes, DDS;  Location: [facility] OR;  Service: Oral Surgery;  Laterality: Bilateral;  extraction teeth #'s 2,3,4,5,17,18,30,31, and 32 with alveoloplasty.  . TONSILLECTOMY     as a child  . TUBAL LIGATION      POb/GynH:      OB History    Gravida  5   Para  5   Term      Preterm      AB      Living  5     SAB      TAB      Ectopic      Multiple      Live Births              SH:   Social History   Tobacco Use  . Smoking status: Current Every Day Smoker    Packs/day: 1.00    Years: 15.00    Pack years: 15.00    Types: Cigarettes  . Smokeless tobacco: Never Used  Substance Use Topics  . Alcohol use: No  . Drug use: Yes    Frequency: 1.0 times per week    Types: Marijuana    Comment: occasional     FH:    Family History  Problem Relation Age of Onset  . Hypertension Father   . Heart disease Father   . Kidney disease Father   . Anesthesia problems Neg Hx   . Hypotension Neg Hx   . Malignant  hyperthermia Neg Hx   . Pseudochol deficiency Neg Hx      Allergies: No Known Allergies  Medications:       Current Facility-Administered Medications:  .  ceFAZolin (ANCEF) IVPB 2g/100 mL premix, 2 g, Intravenous, On Call to OR, Lazaro Arms, MD .  ketorolac (TORADOL) 30 MG/ML injection 30 mg, 30 mg, Intravenous, Once, Lazaro Arms, MD .  lactated ringers infusion, , Intravenous, Continuous, Dorena Cookey, MD, Last Rate: 50 mL/hr at 09/24/17 1139 .  midazolam (VERSED) injection 1-2 mg, 1-2 mg, Intravenous, Q5 min PRN, Dorena Cookey, MD, 1 mg at 09/24/17 1140  Review of Systems:   Review of Systems  Constitutional: Negative for fever, chills, weight loss, malaise/fatigue and diaphoresis.  HENT: Negative for hearing loss, ear pain, nosebleeds, congestion, sore throat, neck pain, tinnitus and ear discharge.   Eyes: Negative for blurred vision, double vision, photophobia, pain, discharge and redness.  Respiratory:  Negative for cough, hemoptysis, sputum production, shortness of breath, wheezing and stridor.   Cardiovascular: Negative for chest pain, palpitations, orthopnea, claudication, leg swelling and PND.  Gastrointestinal: Positive for abdominal pain. Negative for heartburn, nausea, vomiting, diarrhea, constipation, blood in stool and melena.  Genitourinary: Negative for dysuria, urgency, frequency, hematuria and flank pain.  Musculoskeletal: Negative for myalgias, back pain, joint pain and falls.  Skin: Negative for itching and rash.  Neurological: Negative for dizziness, tingling, tremors, sensory change, speech change, focal weakness, seizures, loss of consciousness, weakness and headaches.  Endo/Heme/Allergies: Negative for environmental allergies and polydipsia. Does not bruise/bleed easily.  Psychiatric/Behavioral: Negative for depression, suicidal ideas, hallucinations, memory loss and substance abuse. The patient is not nervous/anxious and does not have insomnia.       PHYSICAL EXAM:  Blood pressure (!) 97/55, pulse 77, temperature 98.6 F (37 C), temperature source Oral, resp. rate (!) 39, SpO2 100 %.    Vitals reviewed. Constitutional: She is oriented to person, place, and time. She appears well-developed and well-nourished.  HENT:  Head: Normocephalic and atraumatic.  Right Ear: External ear normal.  Left Ear: External ear normal.  Nose: Nose normal.  Mouth/Throat: Oropharynx is clear and moist.  Eyes: Conjunctivae and EOM are normal. Pupils are equal, round, and reactive to light. Right eye exhibits no discharge. Left eye exhibits no discharge. No scleral icterus.  Neck: Normal range of motion. Neck supple. No tracheal deviation present. No thyromegaly present.  Cardiovascular: Normal rate, regular rhythm, normal heart sounds and intact distal pulses.  Exam reveals no gallop and no friction rub.   No murmur heard. Respiratory: Effort normal and breath sounds normal. No respiratory distress. She has no wheezes. She has no rales. She exhibits no tenderness.  GI: Soft. Bowel sounds are normal. She exhibits no distension and no mass. There is tenderness. There is no rebound and no guarding.  Genitourinary:       Vulva is normal without lesions Vagina is pink moist without discharge Cervix normal in appearance and pap is normal Uterus is normal size, contour, position, consistency, mobility, non-tender Adnexa is negative with normal sized ovaries by sonogram  Musculoskeletal: Normal range of motion. She exhibits no edema and no tenderness.  Neurological: She is alert and oriented to person, place, and time. She has normal reflexes. She displays normal reflexes. No cranial nerve deficit. She exhibits normal muscle tone. Coordination normal.  Skin: Skin is warm and dry. No rash noted. No erythema. No pallor.  Psychiatric: She has a normal mood and affect. Her behavior is normal. Judgment and thought content normal.    Labs: Results for orders  placed or performed during the hospital encounter of 09/17/17 (from the past 336 hour(s))  CBC   Collection Time: 09/17/17  2:00 PM  Result Value Ref Range   WBC 7.7 4.0 - 10.5 K/uL   RBC 4.94 3.87 - 5.11 MIL/uL   Hemoglobin 13.8 12.0 - 15.0 g/dL   HCT 29.541.2 62.136.0 - 30.846.0 %   MCV 83.4 78.0 - 100.0 fL   MCH 27.9 26.0 - 34.0 pg   MCHC 33.5 30.0 - 36.0 g/dL   RDW 65.713.5 84.611.5 - 96.215.5 %   Platelets 244 150 - 400 K/uL  Comprehensive metabolic panel   Collection Time: 09/17/17  2:00 PM  Result Value Ref Range   Sodium 138 135 - 145 mmol/L   Potassium 4.0 3.5 - 5.1 mmol/L   Chloride 108 98 - 111 mmol/L   CO2 21 (L) 22 -  32 mmol/L   Glucose, Bld 85 70 - 99 mg/dL   BUN 15 6 - 20 mg/dL   Creatinine, Ser 1.61 0.44 - 1.00 mg/dL   Calcium 9.6 8.9 - 09.6 mg/dL   Total Protein 8.1 6.5 - 8.1 g/dL   Albumin 4.3 3.5 - 5.0 g/dL   AST 12 (L) 15 - 41 U/L   ALT 12 0 - 44 U/L   Alkaline Phosphatase 53 38 - 126 U/L   Total Bilirubin 0.9 0.3 - 1.2 mg/dL   GFR calc non Af Amer >60 >60 mL/min   GFR calc Af Amer >60 >60 mL/min   Anion gap 9 5 - 15  hCG, quantitative, pregnancy   Collection Time: 09/17/17  2:00 PM  Result Value Ref Range   hCG, Beta Chain, Quant, S <1 <5 mIU/mL  Rapid HIV screen (HIV 1/2 Ab+Ag)   Collection Time: 09/17/17  2:00 PM  Result Value Ref Range   HIV-1 P24 Antigen - HIV24 NON REACTIVE NON REACTIVE   HIV 1/2 Antibodies NON REACTIVE NON REACTIVE   Interpretation (HIV Ag Ab)      A non reactive test result means that HIV 1 or HIV 2 antibodies and HIV 1 p24 antigen were not detected in the specimen.  Urinalysis, Routine w reflex microscopic   Collection Time: 09/17/17  2:00 PM  Result Value Ref Range   Color, Urine YELLOW YELLOW   APPearance HAZY (A) CLEAR   Specific Gravity, Urine 1.028 1.005 - 1.030   pH 5.0 5.0 - 8.0   Glucose, UA NEGATIVE NEGATIVE mg/dL   Hgb urine dipstick NEGATIVE NEGATIVE   Bilirubin Urine NEGATIVE NEGATIVE   Ketones, ur NEGATIVE NEGATIVE mg/dL    Protein, ur NEGATIVE NEGATIVE mg/dL   Nitrite NEGATIVE NEGATIVE   Leukocytes, UA NEGATIVE NEGATIVE    EKG: Orders placed or performed in visit on 09/17/17  . EKG 12-Lead  . EKG 12-Lead    Imaging Studies: US Pelvis Transvanginal Non-ob (tv Only)  Result Date: 09/02/2017 GYNECOLOGIC SONOGRAM PAISLEA HATTON is a 47 y.o. G5P5 LMP 07/09/2017,she is here for a pelvic sonogram for menometrorrhagia/dysmenorrhea. Uterus                      8.6 x 5 x 6.2 cm, Vol 138 ml,heterogeneous anteverted uterus,no measurable fibroids Endometrium          9.7 mm, symmetrical, wnl Right ovary             2.3 x 2 x 2.3 cm, wnl Left ovary                2.2 x 1.8 x 1.7 cm, wnl No free fluid Technician Comments: PELVIC US TA/TV:heterogeneous anteverted uterus,no measurable fibroids,normal ovaries bilat,ovaries appear mobile,EEC 9.61mm,1.5 cm complex nabothian cyst,no free fluid,no pain during ultrasound [employer] 09/02/2017 11:38 AM Clinical Impression and recommendations: I have reviewed the sonogram results above, combined with the patient's current clinical course, below are my impressions and any appropriate recommendations for management based on the sonographic findings. Uterus is normal with normal endometrium, no abnormalities Ovaries are normal Lazaro Arms 09/02/2017 12:04 PM   US Pelvis (transabdominal Only)  Result Date: 09/02/2017 GYNECOLOGIC SONOGRAM MARSHIA TROPEA is a 47 y.o. G5P5 LMP 07/09/2017,she is here for a pelvic sonogram for menometrorrhagia/dysmenorrhea. Uterus                      8.6 x 5 x 6.2 cm, Vol  138 ml,heterogeneous anteverted uterus,no measurable fibroids Endometrium          9.7 mm, symmetrical, wnl Right ovary             2.3 x 2 x 2.3 cm, wnl Left ovary                2.2 x 1.8 x 1.7 cm, wnl No free fluid Technician Comments: PELVIC US TA/TV:heterogeneous anteverted uterus,no measurable fibroids,normal ovaries bilat,ovaries appear mobile,EEC 9.38mm,1.5 cm complex nabothian cyst,no  free fluid,no pain during ultrasound [employer] 09/02/2017 11:38 AM Clinical Impression and recommendations: I have reviewed the sonogram results above, combined with the patient's current clinical course, below are my impressions and any appropriate recommendations for management based on the sonographic findings. Uterus is normal with normal endometrium, no abnormalities Ovaries are normal Lazaro Arms 09/02/2017 12:04 PM     Assessment: Menometrorrhagia dysmenorrhea  Plan: Hysteroscopy uterine curettage [place] endometrial ablation  Lazaro Arms 09/24/2017 12:04 PM

## 2017-09-24 NOTE — Op Note (Signed)
Preoperative diagnosis:  1.   menometrorrhagia                                         2.  dysmenorrhea   Postoperative diagnoses: Same as above   Procedure: Hysteroscopy, uterine curettage, endometrial ablation using Minerva  Surgeon: Lazaro ArmsLuther H Arantza Darrington   Anesthesia: Laryngeal mask airway  Findings: The endometrium was normal. There were no fibroid or other abnormalities.  Description of operation: The patient was taken to the operating room and placed in the supine position. She underwent general anesthesia using the laryngeal mask airway. She was placed in the dorsal lithotomy position and prepped and draped in the usual sterile fashion. A Graves speculum was placed and the anterior cervical lip was grasped with a single-tooth tenaculum. The cervix was dilated serially to allow passage of the hysteroscope. Diagnostic hysteroscopy was performed and was found to be normal. A vigorous uterine curettage was then performed and all tissue sent to pathology for evaluation.  I then proceeded to perform the Minerva endometrial ablation.   The uterus sounded to 9.5 cm The handpiece was attached to the Minerva power source/machine and the handpiece passed the checklist. The array was squeezed down to remove all of the air present.  The array was then place into the endometrial cavity and deployed to a length of 6.5 cm. The handpiece confirmed appropriate width by being in the green portion of the visual dial. The cervical cuff was then inflated to the point the CO2 indicator was in the green. The endometrial integrity check was then performed and integrity sequence was confirmed x 2. The heating was then begun and carried out for a total of 2 minutes(which is standard therapy time). When the plasma cycle was finished,  the cervical cuff was deflated and the array was removed with tissue present on the silicon membrane. There was appropriate post Minerva bleeding and uterine discharge.     All of the  equipment worked well throughout the procedure.  The patient was awakened from anesthesia and taken to the recovery room in good stable condition all counts were correct. She received 2 g of Ancef and 30 mg of Toradol preoperatively. She will be discharged from the recovery room and followed up in the office in 1- 2 weeks.   She can expect 4 weeks of post procedure bloody watery discharge  Lazaro ArmsLuther H Lucindia Lemley, MD   09/24/2017 1:33 PM

## 2017-09-24 NOTE — Anesthesia Procedure Notes (Signed)
Procedure Name: Intubation Date/Time: 09/24/2017 12:56 PM Performed by: Ollen Bowl, CRNA Pre-anesthesia Checklist: Patient identified, Patient being monitored, Timeout performed, Emergency Drugs available and Suction available Patient Re-evaluated:Patient Re-evaluated prior to induction Oxygen Delivery Method: Circle system utilized Preoxygenation: Pre-oxygenation with 100% oxygen Induction Type: IV induction Ventilation: Mask ventilation without difficulty Laryngoscope Size: Mac and 3 Grade View: Grade I Tube type: Oral Tube size: 7.0 mm Number of attempts: 1 Airway Equipment and Method: Stylet Placement Confirmation: ETT inserted through vocal cords under direct vision,  positive ETCO2 and breath sounds checked- equal and bilateral Secured at: 21 cm Tube secured with: Tape Dental Injury: Teeth and Oropharynx as per pre-operative assessment

## 2017-09-24 NOTE — Transfer of Care (Signed)
Immediate Anesthesia Transfer of Care Note  Patient: Becky CabalJennifer W Moger  Procedure(s) Performed: DILATATION & CURETTAGE/HYSTEROSCOPY WITH MINERVA ABLATION (N/A Uterus)  Patient Location: PACU  Anesthesia Type:General  Level of Consciousness: awake, alert  and oriented  Airway & Oxygen Therapy: Patient Spontanous Breathing  Post-op Assessment: Report given to RN  Post vital signs: Reviewed and stable  Last Vitals:  Vitals Value Taken Time  BP 137/90 09/24/2017  1:36 PM  Temp    Pulse 90 09/24/2017  1:40 PM  Resp 16 09/24/2017  1:40 PM  SpO2 91 % 09/24/2017  1:40 PM  Vitals shown include unvalidated device data.  Last Pain:  Vitals:   09/24/17 0820  TempSrc: Oral  PainSc: 0-No pain         Complications: No apparent anesthesia complications

## 2017-09-24 NOTE — Anesthesia Postprocedure Evaluation (Signed)
Anesthesia Post Note  Patient: [name]  Procedure(s) Performed: DILATATION & CURETTAGE/HYSTEROSCOPY WITH MINERVA ABLATION (N/A Uterus)  Patient location during evaluation: PACU Anesthesia Type: General Level of consciousness: awake and alert and oriented Pain management: pain level controlled Vital Signs Assessment: post-procedure vital signs reviewed and stable Respiratory status: spontaneous breathing Cardiovascular status: blood pressure returned to baseline and stable Postop Assessment: no apparent nausea or vomiting Anesthetic complications: no     Last Vitals:  Vitals:   09/24/17 1215 09/24/17 1230  BP: 100/62 108/71  Pulse:    Resp: (!) 0 (!) 26  Temp:    SpO2: 100% 100%    Last Pain:  Vitals:   09/24/17 1400  TempSrc:   PainSc: 0-No pain                 Venus Gilles

## 2017-09-24 NOTE — Anesthesia Preprocedure Evaluation (Signed)
Anesthesia Evaluation  Patient identified by MRN, date of birth, ID band Patient awake    Reviewed: Allergy & Precautions, NPO status , Patient's Chart, lab work & pertinent test results  Airway Mallampati: II  TM Distance: >3 FB Neck ROM: Full    Dental no notable dental hx. (+) Edentulous Upper   Pulmonary neg pulmonary ROS, asthma , pneumonia, resolved, COPD,  COPD inhaler, Current Smoker,    Pulmonary exam normal breath sounds clear to auscultation       Cardiovascular Exercise Tolerance: Good hypertension, Pt. on medications + Peripheral Vascular Disease  negative cardio ROS Normal cardiovascular examI Rhythm:Regular Rate:Normal     Neuro/Psych  Headaches, negative neurological ROS  negative psych ROS   GI/Hepatic negative GI ROS, Neg liver ROS,   Endo/Other  negative endocrine ROS  Renal/GU negative Renal ROS  negative genitourinary   Musculoskeletal negative musculoskeletal ROS (+)   Abdominal   Peds negative pediatric ROS (+)  Hematology negative hematology ROS (+)   Anesthesia Other Findings   Reproductive/Obstetrics negative OB ROS                             Anesthesia Physical Anesthesia Plan  ASA: II  Anesthesia Plan: General   Post-op Pain Management:    Induction: Intravenous  PONV Risk Score and Plan:   Airway Management Planned: Oral ETT  Additional Equipment:   Intra-op Plan:   Post-operative Plan: Extubation in OR  Informed Consent: I have reviewed the patients History and Physical, chart, labs and discussed the procedure including the risks, benefits and alternatives for the proposed anesthesia with the patient or authorized representative who has indicated his/her understanding and acceptance.   Dental advisory given  Plan Discussed with: CRNA  Anesthesia Plan Comments:         Anesthesia Quick Evaluation

## 2017-09-25 ENCOUNTER — Encounter (HOSPITAL_COMMUNITY): Payer: Self-pay | Admitting: Obstetrics & Gynecology

## 2017-10-06 ENCOUNTER — Ambulatory Visit (INDEPENDENT_AMBULATORY_CARE_PROVIDER_SITE_OTHER): Payer: Medicare Other | Admitting: Obstetrics & Gynecology

## 2017-10-06 ENCOUNTER — Encounter: Payer: Self-pay | Admitting: Obstetrics & Gynecology

## 2017-10-06 VITALS — BP 138/94 | HR 83 | Wt 187.0 lb

## 2017-10-06 DIAGNOSIS — Z9889 Other specified postprocedural states: Secondary | ICD-10-CM | POA: Diagnosis not present

## 2017-10-06 NOTE — Progress Notes (Signed)
  HPI: Patient returns for routine postoperative follow-up having undergone hysteroscopy uterine curettage minerva ablation on 09/24/2017.  The patient's immediate postoperative recovery has been unremarkable. Since hospital discharge the patient reports no problems.   Current Outpatient Medications: lisinopril-hydrochlorothiazide (PRINZIDE,ZESTORETIC) 10-12.5 MG tablet, Take 1 tablet by mouth daily., Disp: , Rfl:  Melatonin 5 MG TBDP, Take 5 mg by mouth at bedtime as needed (sleep)., Disp: , Rfl:  PROAIR HFA 108 (90 BASE) MCG/ACT inhaler, use as directed (Patient taking differently: INHALE TWO PUFFS EVERY 6 HOURS AS NEEDED FOR SHORTNESS OF BREATH AND/OR WHEEZING), Disp: 18 g, Rfl: 2 promethazine (PHENERGAN) 25 MG tablet, Take 1 tablet (25 mg total) by mouth every 6 (six) hours as needed for nausea., Disp: 15 tablet, Rfl: 1 SPIRIVA HANDIHALER 18 MCG inhalation capsule, Place 18 mcg into inhaler and inhale daily. , Disp: , Rfl: 0 traMADol (ULTRAM) 50 MG tablet, Take by mouth every 6 (six) hours as needed., Disp: , Rfl:  HYDROcodone-acetaminophen (NORCO/VICODIN) 5-325 MG tablet, Take 1 tablet by mouth every 6 (six) hours as needed. (Patient not taking: Reported on 10/06/2017), Disp: 15 tablet, Rfl: 0 ketorolac (TORADOL) 10 MG tablet, Take 1 tablet (10 mg total) by mouth every 8 (eight) hours as needed. (Patient not taking: Reported on 10/06/2017), Disp: 15 tablet, Rfl: 0  No current facility-administered medications for this visit.     Blood pressure (!) 138/94, pulse 83, weight 187 lb (84.8 kg).  Physical Exam: Watery vaginal discharge no blood Uterus is non tender  Diagnostic Tests:   Pathology: benign  Impression: S/p endometrial ablation  Plan:   Follow up: 1  years  Lazaro ArmsLuther H Eure, MD

## 2017-11-11 DIAGNOSIS — J441 Chronic obstructive pulmonary disease with (acute) exacerbation: Secondary | ICD-10-CM | POA: Diagnosis not present

## 2017-11-11 DIAGNOSIS — I10 Essential (primary) hypertension: Secondary | ICD-10-CM | POA: Diagnosis not present

## 2017-11-11 DIAGNOSIS — E7849 Other hyperlipidemia: Secondary | ICD-10-CM | POA: Diagnosis not present

## 2018-03-13 DIAGNOSIS — E7849 Other hyperlipidemia: Secondary | ICD-10-CM | POA: Diagnosis not present

## 2018-03-13 DIAGNOSIS — J441 Chronic obstructive pulmonary disease with (acute) exacerbation: Secondary | ICD-10-CM | POA: Diagnosis not present

## 2018-03-13 DIAGNOSIS — I10 Essential (primary) hypertension: Secondary | ICD-10-CM | POA: Diagnosis not present

## 2018-06-12 DIAGNOSIS — I10 Essential (primary) hypertension: Secondary | ICD-10-CM | POA: Diagnosis not present

## 2018-06-12 DIAGNOSIS — J441 Chronic obstructive pulmonary disease with (acute) exacerbation: Secondary | ICD-10-CM | POA: Diagnosis not present

## 2018-08-04 ENCOUNTER — Other Ambulatory Visit (HOSPITAL_COMMUNITY): Payer: Self-pay | Admitting: Internal Medicine

## 2018-08-04 DIAGNOSIS — Z1231 Encounter for screening mammogram for malignant neoplasm of breast: Secondary | ICD-10-CM

## 2018-08-12 ENCOUNTER — Ambulatory Visit (HOSPITAL_COMMUNITY): Payer: Medicare Other

## 2018-09-28 DIAGNOSIS — R0602 Shortness of breath: Secondary | ICD-10-CM | POA: Diagnosis not present

## 2018-09-28 DIAGNOSIS — T887XXA Unspecified adverse effect of drug or medicament, initial encounter: Secondary | ICD-10-CM | POA: Diagnosis not present

## 2018-09-28 DIAGNOSIS — R0902 Hypoxemia: Secondary | ICD-10-CM | POA: Diagnosis not present

## 2018-09-28 DIAGNOSIS — R4020 Unspecified coma: Secondary | ICD-10-CM | POA: Diagnosis not present

## 2018-09-28 DIAGNOSIS — T50904A Poisoning by unspecified drugs, medicaments and biological substances, undetermined, initial encounter: Secondary | ICD-10-CM | POA: Diagnosis not present

## 2018-11-03 DIAGNOSIS — I10 Essential (primary) hypertension: Secondary | ICD-10-CM | POA: Diagnosis not present

## 2018-11-03 DIAGNOSIS — Z1389 Encounter for screening for other disorder: Secondary | ICD-10-CM | POA: Diagnosis not present

## 2018-11-03 DIAGNOSIS — Z0001 Encounter for general adult medical examination with abnormal findings: Secondary | ICD-10-CM | POA: Diagnosis not present

## 2018-11-03 DIAGNOSIS — G5601 Carpal tunnel syndrome, right upper limb: Secondary | ICD-10-CM | POA: Diagnosis not present

## 2018-11-03 DIAGNOSIS — J441 Chronic obstructive pulmonary disease with (acute) exacerbation: Secondary | ICD-10-CM | POA: Diagnosis not present

## 2018-11-03 DIAGNOSIS — M549 Dorsalgia, unspecified: Secondary | ICD-10-CM | POA: Diagnosis not present

## 2018-11-12 ENCOUNTER — Inpatient Hospital Stay (HOSPITAL_COMMUNITY): Admission: RE | Admit: 2018-11-12 | Payer: Medicare Other | Source: Ambulatory Visit

## 2018-12-03 DIAGNOSIS — J441 Chronic obstructive pulmonary disease with (acute) exacerbation: Secondary | ICD-10-CM | POA: Diagnosis not present

## 2018-12-03 DIAGNOSIS — M549 Dorsalgia, unspecified: Secondary | ICD-10-CM | POA: Diagnosis not present

## 2019-01-02 DIAGNOSIS — M549 Dorsalgia, unspecified: Secondary | ICD-10-CM | POA: Diagnosis not present

## 2019-01-02 DIAGNOSIS — J441 Chronic obstructive pulmonary disease with (acute) exacerbation: Secondary | ICD-10-CM | POA: Diagnosis not present

## 2019-01-18 DIAGNOSIS — I110 Hypertensive heart disease with heart failure: Secondary | ICD-10-CM | POA: Diagnosis not present

## 2019-01-18 DIAGNOSIS — J45909 Unspecified asthma, uncomplicated: Secondary | ICD-10-CM | POA: Diagnosis not present

## 2019-01-18 DIAGNOSIS — M2550 Pain in unspecified joint: Secondary | ICD-10-CM | POA: Diagnosis not present

## 2019-01-18 DIAGNOSIS — G8929 Other chronic pain: Secondary | ICD-10-CM | POA: Diagnosis not present

## 2019-01-20 ENCOUNTER — Other Ambulatory Visit (HOSPITAL_COMMUNITY): Payer: Self-pay | Admitting: Family Medicine

## 2019-01-20 ENCOUNTER — Ambulatory Visit (HOSPITAL_COMMUNITY)
Admission: RE | Admit: 2019-01-20 | Discharge: 2019-01-20 | Disposition: A | Discharge status: Home / Self Care | Payer: Medicare Other | Source: Ambulatory Visit | Attending: Family Medicine | Admitting: Family Medicine

## 2019-01-20 ENCOUNTER — Other Ambulatory Visit: Payer: Self-pay

## 2019-01-20 DIAGNOSIS — M542 Cervicalgia: Secondary | ICD-10-CM | POA: Insufficient documentation

## 2019-01-20 DIAGNOSIS — M545 Low back pain, unspecified: Secondary | ICD-10-CM

## 2019-01-21 DIAGNOSIS — I110 Hypertensive heart disease with heart failure: Secondary | ICD-10-CM | POA: Diagnosis not present

## 2019-01-21 DIAGNOSIS — E7849 Other hyperlipidemia: Secondary | ICD-10-CM | POA: Diagnosis not present

## 2019-01-21 DIAGNOSIS — D510 Vitamin B12 deficiency anemia due to intrinsic factor deficiency: Secondary | ICD-10-CM | POA: Diagnosis not present

## 2019-01-21 DIAGNOSIS — R5383 Other fatigue: Secondary | ICD-10-CM | POA: Diagnosis not present

## 2019-01-21 DIAGNOSIS — E559 Vitamin D deficiency, unspecified: Secondary | ICD-10-CM | POA: Diagnosis not present

## 2019-01-25 DIAGNOSIS — M545 Low back pain: Secondary | ICD-10-CM | POA: Diagnosis not present

## 2019-02-02 DIAGNOSIS — J441 Chronic obstructive pulmonary disease with (acute) exacerbation: Secondary | ICD-10-CM | POA: Diagnosis not present

## 2019-02-02 DIAGNOSIS — M549 Dorsalgia, unspecified: Secondary | ICD-10-CM | POA: Diagnosis not present

## 2019-05-06 ENCOUNTER — Encounter: Payer: Self-pay | Admitting: Orthopedic Surgery

## 2019-09-06 ENCOUNTER — Ambulatory Visit: Payer: Medicare Other

## 2019-09-06 ENCOUNTER — Encounter: Payer: Self-pay | Admitting: Orthopedic Surgery

## 2019-09-06 ENCOUNTER — Other Ambulatory Visit: Payer: Self-pay

## 2019-09-06 ENCOUNTER — Ambulatory Visit (INDEPENDENT_AMBULATORY_CARE_PROVIDER_SITE_OTHER): Payer: Medicare Other | Admitting: Orthopedic Surgery

## 2019-09-06 VITALS — BP 119/74 | HR 76 | Ht 65.0 in | Wt 142.0 lb

## 2019-09-06 DIAGNOSIS — M79641 Pain in right hand: Secondary | ICD-10-CM

## 2019-09-06 DIAGNOSIS — G5603 Carpal tunnel syndrome, bilateral upper limbs: Secondary | ICD-10-CM | POA: Diagnosis not present

## 2019-09-06 DIAGNOSIS — M1811 Unilateral primary osteoarthritis of first carpometacarpal joint, right hand: Secondary | ICD-10-CM | POA: Diagnosis not present

## 2019-09-06 NOTE — Patient Instructions (Addendum)

## 2019-09-06 NOTE — Progress Notes (Signed)
Patient ID: [name], female   DOB: 05-18-1970, 49 y.o.   MRN: [id number]  Chief Complaint  Patient presents with  . Hand Pain    Bilateral/ R is the worst one at this time    HPI [name] is a 49 y.o. female.  Presents to [place] with history of smoking no history of rheumatoid arthritis or thyroid disease has a positive carpal tunnel test right greater than left but actually complains of pain over the right thumb and right wrist.  She has decreased ability to hold onto things especially using the thumb and index finger.  She is on gabapentin and NSAID and some oral pain medication.  Symptoms have been present for several weeks with no improvement.  She was sent for possibility of carpal tunnel release  Review of Systems Review of Systems  Musculoskeletal: Positive for arthralgias and neck pain.  Neurological: Positive for weakness. Negative for numbness.     Past Medical History:  Diagnosis Date  . Asthma    COPD  . Back pain   . Brain aneurysm    at [redacted]wks pregnant  . Headache(784.0)   . Hyperlipidemia    not on medication yet d/t MD not calling it in yet  . Hypertension    takes Triamertene and Aldomet daily  . Nocturia     Past Surgical History:  Procedure Laterality Date  . CESAREAN SECTION  2012  . DILITATION & CURRETTAGE/HYSTROSCOPY WITH NOVASURE ABLATION N/A 09/24/2017   Procedure: DILATATION & CURETTAGE/HYSTEROSCOPY WITH MINERVA ABLATION;  Surgeon: Lazaro Arms, MD;  Location: [facility];  Service: Gynecology;  Laterality: N/A;  . INTRACRANIAL ANEURYSM REPAIR  06-16-2010  . LAPAROSCOPY  09/26/2010   Procedure: LAPAROSCOPY OPERATIVE;  Surgeon: Lazaro Arms, MD;  Location: [facility];  Service: Gynecology;  Laterality: N/A;  Laparoscopic Bilateral Salpingectomy  . MULTIPLE EXTRACTIONS WITH ALVEOLOPLASTY  05/13/2011   Procedure: MULTIPLE EXTRACION WITH ALVEOLOPLASTY;  Surgeon: Georgia Lopes, DDS;  Location: [facility];  Service: Oral Surgery;  Laterality: Bilateral;   extraction teeth #'s 2,3,4,5,17,18,30,31, and 32 with alveoloplasty.  . TONSILLECTOMY     as a child  . TUBAL LIGATION      Family History  Problem Relation Age of Onset  . Hypertension Father   . Heart disease Father   . Kidney disease Father   . Anesthesia problems Neg Hx   . Hypotension Neg Hx   . Malignant hyperthermia Neg Hx   . Pseudochol deficiency Neg Hx      Social History   Tobacco Use  . Smoking status: Current Every Day Smoker    Packs/day: 1.00    Years: 15.00    Pack years: 15.00    Types: Cigarettes  . Smokeless tobacco: Never Used  Vaping Use  . Vaping Use: Never used  Substance Use Topics  . Alcohol use: No  . Drug use: Yes    Frequency: 1.0 times per week    Types: Marijuana    Comment: occasional     No Known Allergies  Current Outpatient Medications  Medication Sig Dispense Refill  . gabapentin (NEURONTIN) 300 MG capsule Take 300 mg by mouth 3 (three) times daily.    Marland Kitchen lisinopril-hydrochlorothiazide (PRINZIDE,ZESTORETIC) 10-12.5 MG tablet Take 1 tablet by mouth daily.    . Melatonin 5 MG TBDP Take 5 mg by mouth at bedtime as needed (sleep).    . nabumetone (RELAFEN) 750 MG tablet Take 750 mg by mouth 2 (two) times daily.    Marland Kitchen  naproxen (NAPROSYN) 500 MG tablet Take 500 mg by mouth 2 (two) times daily with a meal.    . PROAIR HFA 108 (90 BASE) MCG/ACT inhaler use as directed (Patient taking differently: INHALE TWO PUFFS EVERY 6 HOURS AS NEEDED FOR SHORTNESS OF BREATH AND/OR WHEEZING) 18 g 2  . HYDROcodone-acetaminophen (NORCO/VICODIN) 5-325 MG tablet Take 1 tablet by mouth every 6 (six) hours as needed. (Patient not taking: Reported on 10/06/2017) 15 tablet 0  . ketorolac (TORADOL) 10 MG tablet Take 1 tablet (10 mg total) by mouth every 8 (eight) hours as needed. (Patient not taking: Reported on 10/06/2017) 15 tablet 0  . promethazine (PHENERGAN) 25 MG tablet Take 1 tablet (25 mg total) by mouth every 6 (six) hours as needed for nausea. (Patient not  taking: Reported on 09/06/2019) 15 tablet 1  . SPIRIVA HANDIHALER 18 MCG inhalation capsule Place 18 mcg into inhaler and inhale daily.   0  . traMADol (ULTRAM) 50 MG tablet Take by mouth every 6 (six) hours as needed. (Patient not taking: Reported on 09/06/2019)     No current facility-administered medications for this visit.   No Known Allergies   Physical Exam BP 119/74   Pulse 76   Ht 5\' 5"  (1.651 m)   Wt 142 lb (64.4 kg)   BMI 23.63 kg/m  Physical Exam The patient is well developed well nourished and well groomed.  Orientation to person place and time is normal  Mood is pleasant.  Ambulatory status normal gait and stance   Right  Upper extremity examination reveals the following:  Inspection reveals swelling over the [facility] joint of the right and left thumb with tenderness over the [facility] joint of the right thumb nontender over the left  Painful range of motion of the [facility] joint with weak pinch on the right, there is also tenderness over the right wrist joint Range of motion of the wrist and elbow are normal  Wrist joint is stable  Provocative tests for carpal tunnel Phalen's test  neg Carpal tunnel compression test neg Tinel's test neg  Pulses are normal in the radial and ulnar artery with a normal Allen's test.  normal sensation is noted in the median nerve distribution. Soft touch is normal.     Plan     MEDICAL DECISION SECTION  Carpal tunnel nerve conduction study: Has been completed at the neurologist office.  The report is read out as left and right median palmar responses showed prolonged latencies amplitudes on the right are mild to moderately reduced in the left and right ulnar palmar responses are unremarkable the left and right median motor responses showed prolonged distal latencies and amplitudes and conduction velocities are normal right ulnar motor potential also read as normal     Encounter Diagnoses  Name Primary?  . Bilateral carpal tunnel  syndrome   . Pain in right hand   . Primary osteoarthritis of first carpometacarpal joint of right hand Yes     PLAN:   No orders of the defined types were placed in this encounter.  Thumb splint continue nsaids  Return 6 weeks

## 2019-10-15 ENCOUNTER — Other Ambulatory Visit: Payer: Self-pay

## 2019-10-15 ENCOUNTER — Ambulatory Visit (INDEPENDENT_AMBULATORY_CARE_PROVIDER_SITE_OTHER): Payer: Medicare HMO | Admitting: Orthopedic Surgery

## 2019-10-15 VITALS — BP 115/66 | HR 66 | Ht 65.0 in | Wt 142.0 lb

## 2019-10-15 DIAGNOSIS — M1811 Unilateral primary osteoarthritis of first carpometacarpal joint, right hand: Secondary | ICD-10-CM | POA: Diagnosis not present

## 2019-10-15 NOTE — Patient Instructions (Signed)
REFERRAL FOR HAND SURGERY

## 2019-10-15 NOTE — Progress Notes (Signed)
Chief Complaint  Patient presents with   Follow-up    6 week recheck on right hand.    49 year old female with carpal tunnel syndrome of the right upper extremity also has [facility] arthritis of the right wrist/hand.  Despite the carpal tunnel symptoms still has severe pain in the [facility] joint does not want to have an injection prefers to be referred for surgery  She was treated with a splint and NSAIDs for 6 weeks  Encounter Diagnosis  Name Primary?   Primary osteoarthritis of first carpometacarpal joint of right hand Yes     On initial presentation HPI [name] is a 49 y.o. female.  Presents to [place] with history of smoking no history of rheumatoid arthritis or thyroid disease has a positive carpal tunnel test right greater than left but actually complains of pain over the right thumb and right wrist.  She has decreased ability to hold onto things especially using the thumb and index finger.  She is on gabapentin and NSAID and some oral pain medication.  Symptoms have been present for several weeks with no improvement.  She was sent for possibility of carpal tunnel release  MEDICAL DECISION SECTION  Carpal tunnel nerve conduction study: Has been completed at the neurologist office.  The report is read out as left and right median palmar responses showed prolonged latencies amplitudes on the right are mild to moderately reduced in the left and right ulnar palmar responses are unremarkable the left and right median motor responses showed prolonged distal latencies and amplitudes and conduction velocities are normal right ulnar motor potential also read as normal

## 2021-02-25 ENCOUNTER — Ambulatory Visit (HOSPITAL_COMMUNITY)
Admission: EM | Admit: 2021-02-25 | Discharge: 2021-02-25 | Disposition: A | Discharge status: Home / Self Care | Payer: Medicare Other | Attending: Family Medicine | Admitting: Family Medicine

## 2021-02-25 ENCOUNTER — Other Ambulatory Visit: Payer: Self-pay

## 2021-02-25 NOTE — ED Notes (Signed)
No answer in lobby.

## 2021-02-25 NOTE — ED Triage Notes (Signed)
Call patient from the lobby, no answer.

## 2021-02-25 NOTE — ED Notes (Signed)
Called in lobby  no answer 

## 2021-02-26 ENCOUNTER — Other Ambulatory Visit: Payer: Self-pay

## 2021-02-26 ENCOUNTER — Ambulatory Visit (HOSPITAL_COMMUNITY)
Admission: EM | Admit: 2021-02-26 | Discharge: 2021-02-26 | Disposition: A | Discharge status: Home / Self Care | Payer: Medicare Other | Attending: Urgent Care | Admitting: Urgent Care

## 2021-02-26 ENCOUNTER — Encounter (HOSPITAL_COMMUNITY): Payer: Self-pay | Admitting: Emergency Medicine

## 2021-02-26 DIAGNOSIS — L0291 Cutaneous abscess, unspecified: Secondary | ICD-10-CM | POA: Diagnosis present

## 2021-02-26 MED ORDER — SULFAMETHOXAZOLE-TRIMETHOPRIM 800-160 MG PO TABS: 1.0000 | ORAL_TABLET | Freq: Two times a day (BID) | ORAL | 0 refills | Status: AC

## 2021-02-26 MED ORDER — CEPHALEXIN 500 MG PO CAPS: 500.0000 mg | ORAL_CAPSULE | Freq: Four times a day (QID) | ORAL | 0 refills | Status: AC

## 2021-02-26 NOTE — ED Triage Notes (Signed)
Swollen area behind right ear/base of neck for 4 days.  Location is very tender.  Denies any illness recently.

## 2021-02-26 NOTE — ED Provider Notes (Signed)
[facility]    CSN: [id number] Arrival date & time: 02/26/21  1432      History   Chief Complaint Chief Complaint  Patient presents with   Abscess    HPI [name] is a 50 y.o. female.   50 year old female presents today with a 3-day history of a painful lump to the back of her scalp on the right.  She states there has always been a slight bump there, and believes she scratched it open.  Over the past several days, she reports it has been draining.  She feels the swelling is becoming bigger.  She denies fever, myalgias, or any additional systemic symptoms.  She denies a history of diabetes.  She is not having any right-sided ear pain or headaches.  She denies any pain in her neck or decreased range of motion of her neck.  Full range of motion of jaw.  She has been using warm compresses which she states has been helping draw the fluid out, but denies any additional treatments.   Abscess Associated symptoms: no fatigue and no fever    Past Medical History:  Diagnosis Date   Asthma    COPD   Back pain    Brain aneurysm    at [redacted]wks pregnant   Headache(784.0)    Hyperlipidemia    not on medication yet d/t MD not calling it in yet   Hypertension    takes Triamertene and Aldomet daily   Nocturia     Patient Active Problem List   Diagnosis Date Noted   Dysmenorrhea 07/21/2017   Menorrhagia with irregular cycle 07/21/2017   Screening for colorectal cancer 07/21/2017   Routine cervical smear 07/21/2017   Encounter for gynecological examination with Papanicolaou smear of cervix 07/21/2017   SUI (stress urinary incontinence, female) 07/21/2017   Acute respiratory distress 02/03/2015   Tobacco use disorder 02/03/2015   Essential hypertension 02/03/2015   Bladder spasm 02/03/2015   Community acquired pneumonia    Pneumonia 02/02/2015   Difficulty in walking(719.7) 03/13/2012   Bilateral leg weakness 03/13/2012    Past Surgical History:  Procedure  Laterality Date   CESAREAN SECTION  2012   DILITATION & CURRETTAGE/HYSTROSCOPY WITH NOVASURE ABLATION N/A 09/24/2017   Procedure: DILATATION & CURETTAGE/HYSTEROSCOPY WITH MINERVA ABLATION;  Surgeon: Lazaro Arms, MD;  Location: [facility];  Service: Gynecology;  Laterality: N/A;   INTRACRANIAL ANEURYSM REPAIR  06-16-2010   LAPAROSCOPY  09/26/2010   Procedure: LAPAROSCOPY OPERATIVE;  Surgeon: Lazaro Arms, MD;  Location: [facility];  Service: Gynecology;  Laterality: N/A;  Laparoscopic Bilateral Salpingectomy   MULTIPLE EXTRACTIONS WITH ALVEOLOPLASTY  05/13/2011   Procedure: MULTIPLE EXTRACION WITH ALVEOLOPLASTY;  Surgeon: [place] Lopes, DDS;  Location: [facility];  Service: Oral Surgery;  Laterality: Bilateral;  extraction teeth #'s 2,3,4,5,17,18,30,31, and 32 with alveoloplasty.   TONSILLECTOMY     as a child   TUBAL LIGATION      OB History     Gravida  5   Para  5   Term      Preterm      AB      Living  5      SAB      IAB      Ectopic      Multiple      Live Births               Home Medications    Prior to Admission medications   Medication Sig  Start Date End Date Taking? Authorizing Provider  cephALEXin (KEFLEX) 500 MG capsule Take 1 capsule (500 mg total) by mouth 4 (four) times daily for 10 days. 02/26/21 03/08/21 Yes Heddy Vidana L, PA  sulfamethoxazole-trimethoprim (BACTRIM DS) 800-160 MG tablet Take 1 tablet by mouth 2 (two) times daily for 10 days. 02/26/21 03/08/21 Yes Laportia Carley L, PA  buPROPion (WELLBUTRIN XL) 150 MG 24 hr tablet Take 150 mg by mouth every morning. 01/15/21   [provider]  Melatonin 5 MG TBDP Take 5 mg by mouth at bedtime as needed (sleep).    [provider]  mirtazapine (REMERON) 30 MG tablet Take by mouth.    [provider]  PROAIR HFA 108 (90 BASE) MCG/ACT inhaler use as directed Patient taking differently: INHALE TWO PUFFS EVERY 6 HOURS AS NEEDED FOR SHORTNESS OF BREATH AND/OR WHEEZING 08/06/13   Lazaro Arms, MD    Family History Family History  Problem Relation Age of Onset   Hypertension Father    Heart disease Father    Kidney disease Father    Anesthesia problems Neg Hx    Hypotension Neg Hx    Malignant hyperthermia Neg Hx    Pseudochol deficiency Neg Hx     Social History Social History   Tobacco Use   Smoking status: Every Day    Packs/day: 1.00    Years: 15.00    Pack years: 15.00    Types: Cigarettes   Smokeless tobacco: Never  Vaping Use   Vaping Use: Never used  Substance Use Topics   Alcohol use: No   Drug use: Yes    Frequency: 1.0 times per week    Types: Marijuana    Comment: occasional      Allergies   Patient has no known allergies.   Review of Systems Review of Systems  Constitutional:  Negative for chills, diaphoresis, fatigue and fever.  HENT:  Negative for ear discharge, ear pain, facial swelling, nosebleeds, postnasal drip, sore throat and trouble swallowing.   Musculoskeletal:  Negative for myalgias, neck pain and neck stiffness.  Skin:  Positive for wound (abscess R scalp, self draining). Negative for color change, pallor and rash.  All other systems reviewed and are negative.   Physical Exam Triage Vital Signs ED Triage Vitals [02/26/21 1507]  Enc Vitals Group     BP (!) 136/91     Pulse Rate 87     Resp (!) 22     Temp 98.4 F (36.9 C)     Temp Source Oral     SpO2 97 %     Weight      Height      Head Circumference      Peak Flow      Pain Score      Pain Loc      Pain Edu?      Excl. in GC?    No data found.  Updated Vital Signs BP (!) 136/91 (BP Location: Left Arm)    Pulse 87    Temp 98.4 F (36.9 C) (Oral)    Resp (!) 22    SpO2 97%   Visual Acuity Right Eye Distance:   Left Eye Distance:   Bilateral Distance:    Right Eye Near:   Left Eye Near:    Bilateral Near:     Physical Exam Vitals and nursing note reviewed.  Constitutional:      General: She is not in acute distress.  Appearance:  Normal appearance. She is normal weight. She is not ill-appearing or toxic-appearing.  HENT:     Head: Normocephalic and atraumatic.     Right Ear: Tympanic membrane, ear canal and external ear normal.     Left Ear: Tympanic membrane, ear canal and external ear normal.     Nose: Nose normal. No congestion or rhinorrhea.     Mouth/Throat:     Mouth: Mucous membranes are moist.     Pharynx: Oropharynx is clear. No oropharyngeal exudate or posterior oropharyngeal erythema.  Eyes:     Pupils: Pupils are equal, round, and reactive to light.  Neck:     Vascular: No carotid bruit.  Musculoskeletal:     Cervical back: Normal range of motion and neck supple. No rigidity or tenderness.  Lymphadenopathy:     Cervical: No cervical adenopathy.  Skin:    General: Skin is warm.     Findings: Lesion (single epidermoid cyst with superficial abscess to R scalp near occiput, draining in office. NO nucchal rigidity. NO tenderness or erythema to mastoid process.) present.  Neurological:     General: No focal deficit present.     Mental Status: She is alert. Mental status is at baseline.  Psychiatric:        Mood and Affect: Mood normal.     UC Treatments / Results  Labs (all labs ordered are listed, but only abnormal results are displayed) Labs Reviewed  AEROBIC CULTURE W GRAM STAIN (SUPERFICIAL SPECIMEN)    EKG   Radiology No results found.  Procedures Procedures (including critical care time)  Medications Ordered in UC Medications - No data to display  Initial Impression / Assessment and Plan / UC Course  I have reviewed the triage vital signs and the nursing notes.  Pertinent labs & imaging results that were available during my care of the patient were reviewed by me and considered in my medical decision making (see chart for details).     Epidermoid cyst with draining abscess - continue warm compresses, may use epsom salt soaks. PO abx to cover for MSSA and MRSA. Establish with a  PCP for follow up, or return here if no improvement. Cx sent. May need derm referral in future if cyst remains problematic.  Final Clinical Impressions(s) / UC Diagnoses   Final diagnoses:  Abscess     Discharge Instructions      Soak scalp in warm water with epsom salts. May also use a moist warm compress. Take antibiotics as prescribed, eat yogurt to prevent diarrhea. RTC in 5-6 days if symptoms are not resolving. Head to ER if at any point in time you develop a fever, headache, or if R ear is displaced.    ED Prescriptions     Medication Sig Dispense Auth. Provider   cephALEXin (KEFLEX) 500 MG capsule Take 1 capsule (500 mg total) by mouth 4 (four) times daily for 10 days. 40 capsule Lanny Lipkin L, PA   sulfamethoxazole-trimethoprim (BACTRIM DS) 800-160 MG tablet Take 1 tablet by mouth 2 (two) times daily for 10 days. 20 tablet Dawt Reeb L, [place]      PDMP not reviewed this encounter.   Maretta Bees, [place] 02/26/21 1550

## 2021-02-26 NOTE — Discharge Instructions (Addendum)
Soak scalp in warm water with epsom salts. May also use a moist warm compress. Take antibiotics as prescribed, eat yogurt to prevent diarrhea. RTC in 5-6 days if symptoms are not resolving. Head to ER if at any point in time you develop a fever, headache, or if R ear is displaced.

## 2021-03-01 LAB — AEROBIC CULTURE W GRAM STAIN (SUPERFICIAL SPECIMEN): Special Requests: NORMAL

## 2022-01-17 ENCOUNTER — Encounter (HOSPITAL_COMMUNITY): Payer: Self-pay

## 2022-01-17 ENCOUNTER — Emergency Department (HOSPITAL_COMMUNITY): Payer: Medicare Other

## 2022-01-17 ENCOUNTER — Other Ambulatory Visit: Payer: Self-pay

## 2022-01-17 ENCOUNTER — Emergency Department (HOSPITAL_COMMUNITY)
Admission: EM | Admit: 2022-01-17 | Discharge: 2022-01-18 | Discharge status: Against Medical Advice | Payer: Medicare Other | Attending: Emergency Medicine | Admitting: Emergency Medicine

## 2022-01-17 DIAGNOSIS — R0602 Shortness of breath: Secondary | ICD-10-CM | POA: Diagnosis present

## 2022-01-17 DIAGNOSIS — Z5321 Procedure and treatment not carried out due to patient leaving prior to being seen by health care provider: Secondary | ICD-10-CM | POA: Insufficient documentation

## 2022-01-17 DIAGNOSIS — J449 Chronic obstructive pulmonary disease, unspecified: Secondary | ICD-10-CM | POA: Diagnosis not present

## 2022-01-17 DIAGNOSIS — M542 Cervicalgia: Secondary | ICD-10-CM | POA: Insufficient documentation

## 2022-01-17 DIAGNOSIS — M549 Dorsalgia, unspecified: Secondary | ICD-10-CM | POA: Insufficient documentation

## 2022-01-17 DIAGNOSIS — R059 Cough, unspecified: Secondary | ICD-10-CM | POA: Insufficient documentation

## 2022-01-17 LAB — BASIC METABOLIC PANEL
Anion gap: 10 (ref 5–15)
BUN: 6 mg/dL (ref 6–20)
CO2: 23 mmol/L (ref 22–32)
Calcium: 9 mg/dL (ref 8.9–10.3)
Chloride: 108 mmol/L (ref 98–111)
Creatinine, Ser: 0.76 mg/dL (ref 0.44–1.00)
GFR, Estimated: >60 mL/min (ref >60)
Glucose, Bld: 132 mg/dL — ABNORMAL HIGH (ref 70–99)
Potassium: 3.7 mmol/L (ref 3.5–5.1)
Sodium: 141 mmol/L (ref 135–145)

## 2022-01-17 LAB — CBC
HCT: 40.4 % (ref 36.0–46.0)
Hemoglobin: 13.6 g/dL (ref 12.0–15.0)
MCH: 30.4 pg (ref 26.0–34.0)
MCHC: 33.7 g/dL (ref 30.0–36.0)
MCV: 90.4 fL (ref 80.0–100.0)
Platelets: 174 10*3/uL (ref 150–400)
RBC: 4.47 MIL/uL (ref 3.87–5.11)
RDW: 12.3 % (ref 11.5–15.5)
WBC: 6.4 10*3/uL (ref 4.0–10.5)
nRBC: 0 % (ref 0.0–0.2)

## 2022-01-17 MED ORDER — ALBUTEROL SULFATE HFA 108 (90 BASE) MCG/ACT IN AERS: 2.0000 | INHALATION_SPRAY | RESPIRATORY_TRACT | Status: DC | PRN

## 2022-01-17 NOTE — ED Provider Triage Note (Signed)
Emergency Medicine Provider Triage Evaluation Note  [name] , a 51 y.o. female  was evaluated in triage.  Pt complains of shortness of breath and nonproductive cough for several days. Associated neck and back pain. Hx of COPD, been out of medications for months due to not having PCP. No sick contacts. No hx of blood clots or cardiac problems.   Review of Systems  Positive: SOB, cough, wheezing, neck pain, back pain Negative: Fever, chills, CP, nasal congestion, leg pain or swelling  Physical Exam  BP 111/77   Pulse (!) 106   Temp 98.4 F (36.9 C) (Oral)   Resp (!) 24   Ht 5\' 5"  (1.651 m)   Wt 74.8 kg   SpO2 97%   BMI 27.46 kg/m  Gen:   Awake, no distress   Resp:  Normal effort  MSK:   Moves extremities without difficulty  Other:    Medical Decision Making  Medically screening exam initiated at 9:39 PM.  Appropriate orders placed.  [name] was informed that the remainder of the evaluation will be completed by another provider, this initial triage assessment does not replace that evaluation, and the importance of remaining in the ED until their evaluation is complete.  Workup initiated   [name] T, PA-C 01/17/22 2140

## 2022-01-17 NOTE — ED Triage Notes (Signed)
Pt states she has been having neck pain, back pain, cough, and shortness of breath for past few days. Pt denies fever or chest pain. Pt has had wheezing.

## 2022-01-18 NOTE — ED Notes (Signed)
Pt name called two more times for updated vitals, no response.

## 2022-01-18 NOTE — ED Notes (Signed)
Pt name called for updated vitals, no response 

## 2022-01-19 ENCOUNTER — Emergency Department (HOSPITAL_COMMUNITY): Payer: Medicare Other

## 2022-01-19 ENCOUNTER — Other Ambulatory Visit: Payer: Self-pay

## 2022-01-19 ENCOUNTER — Emergency Department (HOSPITAL_COMMUNITY)
Admission: EM | Admit: 2022-01-19 | Discharge: 2022-01-19 | Disposition: A | Discharge status: Home / Self Care | Payer: Medicare Other | Attending: Emergency Medicine | Admitting: Emergency Medicine

## 2022-01-19 ENCOUNTER — Encounter (HOSPITAL_COMMUNITY): Payer: Self-pay

## 2022-01-19 DIAGNOSIS — J441 Chronic obstructive pulmonary disease with (acute) exacerbation: Secondary | ICD-10-CM | POA: Insufficient documentation

## 2022-01-19 DIAGNOSIS — R0602 Shortness of breath: Secondary | ICD-10-CM | POA: Diagnosis present

## 2022-01-19 DIAGNOSIS — J45909 Unspecified asthma, uncomplicated: Secondary | ICD-10-CM | POA: Insufficient documentation

## 2022-01-19 DIAGNOSIS — I10 Essential (primary) hypertension: Secondary | ICD-10-CM | POA: Insufficient documentation

## 2022-01-19 LAB — CBC
HCT: 40 % (ref 36.0–46.0)
Hemoglobin: 13.2 g/dL (ref 12.0–15.0)
MCH: 30.3 pg (ref 26.0–34.0)
MCHC: 33 g/dL (ref 30.0–36.0)
MCV: 91.7 fL (ref 80.0–100.0)
Platelets: 192 10*3/uL (ref 150–400)
RBC: 4.36 MIL/uL (ref 3.87–5.11)
RDW: 12.6 % (ref 11.5–15.5)
WBC: 8.8 10*3/uL (ref 4.0–10.5)
nRBC: 0 % (ref 0.0–0.2)

## 2022-01-19 LAB — BASIC METABOLIC PANEL
Anion gap: 5 (ref 5–15)
BUN: 7 mg/dL (ref 6–20)
CO2: 27 mmol/L (ref 22–32)
Calcium: 9.3 mg/dL (ref 8.9–10.3)
Chloride: 111 mmol/L (ref 98–111)
Creatinine, Ser: 0.72 mg/dL (ref 0.44–1.00)
GFR, Estimated: >60 mL/min (ref >60)
Glucose, Bld: 116 mg/dL — ABNORMAL HIGH (ref 70–99)
Potassium: 4.2 mmol/L (ref 3.5–5.1)
Sodium: 143 mmol/L (ref 135–145)

## 2022-01-19 MED ORDER — ALBUTEROL SULFATE HFA 108 (90 BASE) MCG/ACT IN AERS
2.0000 | INHALATION_SPRAY | Freq: Once | RESPIRATORY_TRACT | Status: AC
  Administered 2022-01-19: 2 via RESPIRATORY_TRACT
  Filled 2022-01-19: qty 6.7

## 2022-01-19 MED ORDER — AZITHROMYCIN 250 MG PO TABS
500.0000 mg | ORAL_TABLET | Freq: Once | ORAL | Status: AC
  Administered 2022-01-19: 500 mg via ORAL
  Filled 2022-01-19: qty 2

## 2022-01-19 MED ORDER — ALBUTEROL SULFATE (5 MG/ML) 0.5% IN NEBU: 2.5000 mg | INHALATION_SOLUTION | Freq: Four times a day (QID) | RESPIRATORY_TRACT | 12 refills | Status: DC | PRN

## 2022-01-19 MED ORDER — AZITHROMYCIN 250 MG PO TABS: 250.0000 mg | ORAL_TABLET | Freq: Every day | ORAL | 0 refills | Status: AC

## 2022-01-19 MED ORDER — METHYLPREDNISOLONE SODIUM SUCC 125 MG IJ SOLR
125.0000 mg | Freq: Once | INTRAMUSCULAR | Status: AC
  Administered 2022-01-19: 125 mg via INTRAVENOUS
  Filled 2022-01-19: qty 2

## 2022-01-19 MED ORDER — IPRATROPIUM-ALBUTEROL 0.5-2.5 (3) MG/3ML IN SOLN
3.0000 mL | Freq: Once | RESPIRATORY_TRACT | Status: AC
  Administered 2022-01-19: 3 mL via RESPIRATORY_TRACT
  Filled 2022-01-19: qty 3

## 2022-01-19 NOTE — ED Provider Notes (Signed)
[facility] EMERGENCY DEPARTMENT Provider Note   CSN: [id number] Arrival date & time: 01/19/22  1643     History  Chief Complaint  Patient presents with   Shortness of Breath    [name] is a 51 y.o. female with history of asthma, brain aneurysm, hyperlipidemia, hypertension who presents the emergency department complaining of shortness of breath.  Patient states symptoms have been going on for about 2 days.  She is complaining of shortness of breath, wheezing, chest congestion, productive cough.  No fever or chills.  She has been trying a nebulizer breathing treatment at home, but reports its about 4 years expired.  She does not have any of her other asthma medications at home.  Of note, patient was triaged by myself at different ER 2 evenings ago.  She left without being seen due to wait times.   Shortness of Breath Associated symptoms: cough and wheezing   Associated symptoms: no chest pain and no fever        Home Medications Prior to Admission medications   Medication Sig Start Date End Date Taking? Authorizing Provider  albuterol (PROVENTIL) (5 MG/ML) 0.5% nebulizer solution Take 0.5 mLs (2.5 mg total) by nebulization every 6 (six) hours as needed for wheezing or shortness of breath. 01/19/22  Yes Bryndon Cumbie T, PA-C  azithromycin (ZITHROMAX) 250 MG tablet Take 1 tablet (250 mg total) by mouth daily for 4 days. 01/19/22 01/23/22 Yes Jayvon Mounger T, PA-C  buPROPion (WELLBUTRIN XL) 150 MG 24 hr tablet Take 150 mg by mouth every morning. 01/15/21   [provider]  Melatonin 5 MG TBDP Take 5 mg by mouth at bedtime as needed (sleep).    [provider]  mirtazapine (REMERON) 30 MG tablet Take by mouth.    [provider]      Allergies    Patient has no known allergies.    Review of Systems   Review of Systems  Constitutional:  Negative for chills and fever.  HENT:  Positive for congestion.   Respiratory:  Positive for cough,  shortness of breath and wheezing.   Cardiovascular:  Negative for chest pain.  All other systems reviewed and are negative.   Physical Exam Updated Vital Signs BP 124/88 (BP Location: Left Arm)   Pulse 74   Temp 98.2 F (36.8 C) (Oral)   Resp (!) 22   Ht 5\' 5"  (1.651 m)   Wt 74.8 kg   SpO2 99%   BMI 27.46 kg/m  Physical Exam Vitals and nursing note reviewed.  Constitutional:      Appearance: Normal appearance.  HENT:     Head: Normocephalic and atraumatic.  Eyes:     Conjunctiva/sclera: Conjunctivae normal.  Cardiovascular:     Rate and Rhythm: Normal rate and regular rhythm.  Pulmonary:     Effort: Pulmonary effort is normal. No respiratory distress.     Breath sounds: No decreased air movement. Wheezing and rhonchi present.     Comments: No increased respiratory effort.  Inspiratory and expiratory wheezing noted with rhonchi and all lung fields.  Good air movement to the bases. Abdominal:     General: There is no distension.     Palpations: Abdomen is soft.     Tenderness: There is no abdominal tenderness.  Skin:    General: Skin is warm and dry.  Neurological:     General: No focal deficit present.     Mental Status: She is alert.     ED  Results / Procedures / Treatments   Labs (all labs ordered are listed, but only abnormal results are displayed) Labs Reviewed  BASIC METABOLIC PANEL - Abnormal; Notable for the following components:      Result Value   Glucose, Bld 116 (*)    All other components within normal limits  CBC    EKG None  Radiology DG Chest 2 View  Result Date: 01/19/2022 CLINICAL DATA:  sob EXAM: CHEST - 2 VIEW COMPARISON:  Chest x-ray 01/17/2022, CT chest 02/15/2015 FINDINGS: The heart and mediastinal contours are within normal limits. Left lower lobe airspace opacity. Bibasilar linear atelectasis. No pulmonary edema. Trace volume loculated right pleural effusion within the minor fissure. Likely trace left pleural effusion. No  pneumothorax. No acute osseous abnormality. IMPRESSION: 1. Left lower lobe airspace opacity which may represent a combination of atelectasis versus infection/inflammation. Followup PA and lateral chest X-ray is recommended in 3-4 weeks following therapy to ensure resolution and exclude underlying malignancy. 2. Trace volume loculated right pleural effusion within the minor fissure. 3. Likely trace left pleural effusion. Electronically Signed   By: Tish Frederickson M.D.   On: 01/19/2022 18:28   DG Chest 2 View  Result Date: 01/17/2022 CLINICAL DATA:  Shortness of breath. Cough. EXAM: CHEST - 2 VIEW COMPARISON:  Radiograph 02/02/2015 CT 01/16/2015 FINDINGS: The heart is normal in size. Stable mediastinal contours. There are streaky opacities within both dependent lung bases. The previous pulmonary nodules on CT are not well seen by radiograph. Possible hiatal hernia. No significant pleural effusion. No pneumothorax. No pulmonary edema. IMPRESSION: Streaky opacities within both lung bases, favor atelectasis, however may represent atypical viral infection. Electronically Signed   By: Narda Rutherford M.D.   On: 01/17/2022 22:16    Procedures Procedures    Medications Ordered in ED Medications  methylPREDNISolone sodium succinate (SOLU-MEDROL) 125 mg/2 mL injection 125 mg (125 mg Intravenous Given 01/19/22 1910)  ipratropium-albuterol (DUONEB) 0.5-2.5 (3) MG/3ML nebulizer solution 3 mL (3 mLs Nebulization Given 01/19/22 1910)  albuterol (VENTOLIN HFA) 108 (90 Base) MCG/ACT inhaler 2 puff (2 puffs Inhalation Given 01/19/22 2024)  azithromycin (ZITHROMAX) tablet 500 mg (500 mg Oral Given 01/19/22 2024)    ED Course/ Medical Decision Making/ A&P                           Medical Decision Making Amount and/or Complexity of Data Reviewed Labs: ordered. Radiology: ordered.  Risk Prescription drug management.  This patient is a 51 y.o. female  who presents to the ED for concern of SOB.    Differential diagnoses prior to evaluation: The emergent differential diagnosis includes, but is not limited to,  CHF, pericardial effusion/tamponade, arrhythmias, ACS, COPD, asthma, bronchitis, pneumonia, pneumothorax, PE, anemia. This is not an exhaustive differential.   Past Medical History / Co-morbidities: asthma, brain aneurysm, hyperlipidemia, hypertension  Additional history: Chart reviewed. Pertinent results include: Pt initially went to [facility] ER on 11/9 and was triaged by myself.  Left without being seen due to wait times.  Physical Exam: Physical exam performed. The pertinent findings include: Normal oxygen saturation on room air.  Inspiratory and expiratory wheezing with rhonchorous lung sounds in all fields.  Good air movement to the bases.  Lab Tests/Imaging studies: I personally interpreted labs/imaging and the pertinent results include: No leukocytosis.  BMP unremarkable.  Chest x-ray with trace effusions, possible left lower airspace disease. I agree with the radiologist interpretation.  Cardiac monitoring: EKG obtained and  interpreted by my attending physician which shows: Sinus rhythm   Medications: I ordered medication including steroids, breathing treatment, inhaler, antibiotics.  I have reviewed the patients home medicines and have made adjustments as needed.  On reevaluation patient states that she is feeling much better.  Lung sounds are significantly improved.   Disposition: After consideration of the diagnostic results and the patients response to treatment, I feel that emergency department workup does not suggest an emergent condition requiring admission or immediate intervention beyond what has been performed at this time. The plan is: Charged home with prescription for nebulizer and continued antibiotics for possible pneumonia.  Provided contact information for follow-up with pulmonary clinic.  The patient is safe for discharge and has been instructed to  return immediately for worsening symptoms, change in symptoms or any other concerns.  I discussed this case with my attending physician Dr. Estell Harpin who cosigned this note including patient's presenting symptoms, physical exam, and planned diagnostics and interventions. Attending physician stated agreement with plan or made changes to plan which were implemented.   Final Clinical Impression(s) / ED Diagnoses Final diagnoses:  COPD exacerbation (HCC)    Rx / DC Orders ED Discharge Orders          Ordered    azithromycin (ZITHROMAX) 250 MG tablet  Daily        01/19/22 2017    albuterol (PROVENTIL) (5 MG/ML) 0.5% nebulizer solution  Every 6 hours PRN        01/19/22 2021           Portions of this report may have been transcribed using voice recognition software. Every effort was made to ensure accuracy; however, inadvertent computerized transcription errors may be present.    Jeanella Flattery 01/19/22 2029    Bethann Berkshire, MD 01/22/22 1103

## 2022-01-19 NOTE — Discharge Instructions (Addendum)
You were seen in the urgency department today for shortness of breath and asthma/COPD exacerbation.  We gave you a breathing treatment, steroids, and antibiotics.  I have sent a prescription to your pharmacy for nebulizer and the rest of the antibiotics for possible pneumonia.  Your oxygen levels have been normal while you are here, so do not think that you need to be admitted to the hospital.  You can take over-the-counter decongestants as needed as well.  Continue to monitor how you are doing and return to the emergency department for any new or worsening symptoms.  I'd like you to follow up with the pulmonary doctors. I've attached their contact information for you to call and make an appointment.

## 2022-01-19 NOTE — ED Triage Notes (Signed)
Patient presents to ED via POV, c/o shortness of breath, wheezing and chest congestion for 2 days. Patient taking Nebulizer treatments four  times a day without improvement at home. She has history of asthma. Pt denies fever.

## 2022-05-07 DIAGNOSIS — M109 Gout, unspecified: Secondary | ICD-10-CM | POA: Diagnosis not present

## 2022-05-07 DIAGNOSIS — E663 Overweight: Secondary | ICD-10-CM | POA: Diagnosis not present

## 2022-05-07 DIAGNOSIS — Z6829 Body mass index (BMI) 29.0-29.9, adult: Secondary | ICD-10-CM | POA: Diagnosis not present

## 2022-05-09 ENCOUNTER — Encounter: Payer: Self-pay | Admitting: Radiology

## 2022-05-28 DIAGNOSIS — E559 Vitamin D deficiency, unspecified: Secondary | ICD-10-CM | POA: Diagnosis not present

## 2022-05-28 DIAGNOSIS — Z131 Encounter for screening for diabetes mellitus: Secondary | ICD-10-CM | POA: Diagnosis not present

## 2022-05-28 DIAGNOSIS — R5383 Other fatigue: Secondary | ICD-10-CM | POA: Diagnosis not present

## 2022-05-28 DIAGNOSIS — Z1159 Encounter for screening for other viral diseases: Secondary | ICD-10-CM | POA: Diagnosis not present

## 2022-06-13 DIAGNOSIS — I10 Essential (primary) hypertension: Secondary | ICD-10-CM | POA: Diagnosis not present

## 2022-06-13 DIAGNOSIS — R7303 Prediabetes: Secondary | ICD-10-CM | POA: Diagnosis not present

## 2022-06-13 DIAGNOSIS — R030 Elevated blood-pressure reading, without diagnosis of hypertension: Secondary | ICD-10-CM | POA: Diagnosis not present

## 2022-06-13 DIAGNOSIS — Z6829 Body mass index (BMI) 29.0-29.9, adult: Secondary | ICD-10-CM | POA: Diagnosis not present

## 2022-06-13 DIAGNOSIS — K2960 Other gastritis without bleeding: Secondary | ICD-10-CM | POA: Diagnosis not present

## 2022-06-13 DIAGNOSIS — F17210 Nicotine dependence, cigarettes, uncomplicated: Secondary | ICD-10-CM | POA: Diagnosis not present

## 2022-06-17 DIAGNOSIS — I10 Essential (primary) hypertension: Secondary | ICD-10-CM | POA: Diagnosis not present

## 2022-06-17 DIAGNOSIS — F32A Depression, unspecified: Secondary | ICD-10-CM | POA: Diagnosis not present

## 2022-06-17 DIAGNOSIS — J45909 Unspecified asthma, uncomplicated: Secondary | ICD-10-CM | POA: Diagnosis not present

## 2022-06-17 DIAGNOSIS — F419 Anxiety disorder, unspecified: Secondary | ICD-10-CM | POA: Diagnosis not present

## 2022-07-02 DIAGNOSIS — Z1211 Encounter for screening for malignant neoplasm of colon: Secondary | ICD-10-CM | POA: Diagnosis not present

## 2022-07-02 DIAGNOSIS — R1319 Other dysphagia: Secondary | ICD-10-CM | POA: Diagnosis not present

## 2022-07-02 DIAGNOSIS — K219 Gastro-esophageal reflux disease without esophagitis: Secondary | ICD-10-CM | POA: Diagnosis not present

## 2022-07-26 DIAGNOSIS — K219 Gastro-esophageal reflux disease without esophagitis: Secondary | ICD-10-CM | POA: Diagnosis not present

## 2022-07-26 DIAGNOSIS — Z1211 Encounter for screening for malignant neoplasm of colon: Secondary | ICD-10-CM | POA: Diagnosis not present

## 2022-07-26 DIAGNOSIS — Z01818 Encounter for other preprocedural examination: Secondary | ICD-10-CM | POA: Diagnosis not present

## 2022-08-04 DIAGNOSIS — K200 Eosinophilic esophagitis: Secondary | ICD-10-CM | POA: Diagnosis not present

## 2022-08-22 DIAGNOSIS — R7303 Prediabetes: Secondary | ICD-10-CM | POA: Diagnosis not present

## 2022-08-22 DIAGNOSIS — Z0130 Encounter for examination of blood pressure without abnormal findings: Secondary | ICD-10-CM | POA: Diagnosis not present

## 2022-08-22 DIAGNOSIS — Z6830 Body mass index (BMI) 30.0-30.9, adult: Secondary | ICD-10-CM | POA: Diagnosis not present

## 2022-08-22 DIAGNOSIS — K0889 Other specified disorders of teeth and supporting structures: Secondary | ICD-10-CM | POA: Diagnosis not present

## 2022-08-22 DIAGNOSIS — F17210 Nicotine dependence, cigarettes, uncomplicated: Secondary | ICD-10-CM | POA: Diagnosis not present

## 2022-08-22 DIAGNOSIS — E559 Vitamin D deficiency, unspecified: Secondary | ICD-10-CM | POA: Diagnosis not present

## 2022-08-22 DIAGNOSIS — Z122 Encounter for screening for malignant neoplasm of respiratory organs: Secondary | ICD-10-CM | POA: Diagnosis not present

## 2022-08-22 DIAGNOSIS — Z789 Other specified health status: Secondary | ICD-10-CM | POA: Diagnosis not present

## 2022-09-04 DIAGNOSIS — K219 Gastro-esophageal reflux disease without esophagitis: Secondary | ICD-10-CM | POA: Diagnosis not present

## 2022-09-04 DIAGNOSIS — Z1212 Encounter for screening for malignant neoplasm of rectum: Secondary | ICD-10-CM | POA: Diagnosis not present

## 2022-09-04 DIAGNOSIS — Z1211 Encounter for screening for malignant neoplasm of colon: Secondary | ICD-10-CM | POA: Diagnosis not present

## 2022-09-04 DIAGNOSIS — K2970 Gastritis, unspecified, without bleeding: Secondary | ICD-10-CM | POA: Diagnosis not present

## 2022-09-13 DIAGNOSIS — H52223 Regular astigmatism, bilateral: Secondary | ICD-10-CM | POA: Diagnosis not present

## 2023-10-30 ENCOUNTER — Ambulatory Visit
Admission: EM | Admit: 2023-10-30 | Discharge: 2023-10-30 | Disposition: A | Discharge status: Home / Self Care | Attending: Family Medicine | Admitting: Family Medicine

## 2023-10-30 ENCOUNTER — Encounter: Payer: Self-pay | Admitting: Emergency Medicine

## 2023-10-30 DIAGNOSIS — J441 Chronic obstructive pulmonary disease with (acute) exacerbation: Secondary | ICD-10-CM

## 2023-10-30 DIAGNOSIS — J029 Acute pharyngitis, unspecified: Secondary | ICD-10-CM

## 2023-10-30 DIAGNOSIS — J069 Acute upper respiratory infection, unspecified: Secondary | ICD-10-CM | POA: Diagnosis not present

## 2023-10-30 LAB — POCT RAPID STREP A (OFFICE): Rapid Strep A Screen: NEGATIVE

## 2023-10-30 LAB — POC SOFIA SARS ANTIGEN FIA: SARS Coronavirus 2 Ag: NEGATIVE

## 2023-10-30 MED ORDER — IPRATROPIUM-ALBUTEROL 0.5-2.5 (3) MG/3ML IN SOLN
3.0000 mL | Freq: Once | RESPIRATORY_TRACT | Status: AC
  Administered 2023-10-30: 3 mL via RESPIRATORY_TRACT

## 2023-10-30 MED ORDER — LIDOCAINE VISCOUS HCL 2 % MT SOLN: 10.0000 mL | OROMUCOSAL | 0 refills | Status: AC | PRN

## 2023-10-30 MED ORDER — PROMETHAZINE-DM 6.25-15 MG/5ML PO SYRP: 5.0000 mL | ORAL_SOLUTION | Freq: Four times a day (QID) | ORAL | 0 refills | Status: AC | PRN

## 2023-10-30 MED ORDER — ALBUTEROL SULFATE HFA 108 (90 BASE) MCG/ACT IN AERS: 2.0000 | INHALATION_SPRAY | RESPIRATORY_TRACT | 0 refills | Status: AC | PRN

## 2023-10-30 NOTE — ED Triage Notes (Signed)
 Pain around back and bilateral hip area, sore throat, body aches since Tuesday.

## 2023-10-30 NOTE — ED Provider Notes (Signed)
 [facility]    CSN: [id number] Arrival date & time: 10/30/23  1343      History   Chief Complaint Chief Complaint  Patient presents with   Sore Throat   Generalized Body Aches    HPI [name] is a 53 y.o. female.   Patient presenting today with 2-day history of bodyaches, sore throat, congestion, hacking cough, wheezing.  Denies chest pain, shortness of breath, abdominal pain, vomiting, diarrhea, fevers.  So far not trying anything over-the-counter for symptoms.  History of asthma and COPD not currently on any inhalers for this.    Past Medical History:  Diagnosis Date   Asthma    COPD   Back pain    Brain aneurysm    at [redacted]wks pregnant   Headache(784.0)    Hyperlipidemia    not on medication yet d/t MD not calling it in yet   Hypertension    takes Triamertene and Aldomet daily   Nocturia     Patient Active Problem List   Diagnosis Date Noted   Dysmenorrhea 07/21/2017   Menorrhagia with irregular cycle 07/21/2017   Screening for colorectal cancer 07/21/2017   Routine cervical smear 07/21/2017   Encounter for gynecological examination with Papanicolaou smear of cervix 07/21/2017   SUI (stress urinary incontinence, female) 07/21/2017   Acute respiratory distress 02/03/2015   Tobacco use disorder 02/03/2015   Essential hypertension 02/03/2015   Bladder spasm 02/03/2015   Community acquired pneumonia    Pneumonia 02/02/2015   Difficulty walking 03/13/2012   Bilateral leg weakness 03/13/2012    Past Surgical History:  Procedure Laterality Date   CESAREAN SECTION  2012   DILITATION & CURRETTAGE/HYSTROSCOPY WITH NOVASURE ABLATION N/A 09/24/2017   Procedure: DILATATION & CURETTAGE/HYSTEROSCOPY WITH MINERVA ABLATION;  Surgeon: Jayne Vonn DEL, MD;  Location: [facility];  Service: Gynecology;  Laterality: N/A;   INTRACRANIAL ANEURYSM REPAIR  06-16-2010   LAPAROSCOPY  09/26/2010   Procedure: LAPAROSCOPY OPERATIVE;  Surgeon: Vonn DEL Jayne, MD;  Location:  [facility];  Service: Gynecology;  Laterality: N/A;  Laparoscopic Bilateral Salpingectomy   MULTIPLE EXTRACTIONS WITH ALVEOLOPLASTY  05/13/2011   Procedure: MULTIPLE EXTRACION WITH ALVEOLOPLASTY;  Surgeon: Glendia CHRISTELLA Primrose, DDS;  Location: [facility];  Service: Oral Surgery;  Laterality: Bilateral;  extraction teeth #'s 2,3,4,5,17,18,30,31, and 32 with alveoloplasty.   TONSILLECTOMY     as a child   TUBAL LIGATION      OB History     Gravida  5   Para  5   Term      Preterm      AB      Living  5      SAB      IAB      Ectopic      Multiple      Live Births               Home Medications    Prior to Admission medications   Medication Sig Start Date End Date Taking? Authorizing Provider  albuterol  (VENTOLIN  HFA) 108 (90 Base) MCG/ACT inhaler Inhale 2 puffs into the lungs every 4 (four) hours as needed. 10/30/23  Yes Stuart Vernell Norris, PA-C  lidocaine  (XYLOCAINE ) 2 % solution Use as directed 10 mLs in the mouth or throat every 3 (three) hours as needed. 10/30/23  Yes Stuart Vernell Norris, PA-C  promethazine -dextromethorphan (PROMETHAZINE -DM) 6.25-15 MG/5ML syrup Take 5 mLs by mouth 4 (four) times daily as needed. 10/30/23  Yes Stuart Vernell Norris, PA-C  Family History Family History  Problem Relation Age of Onset   Hypertension Father    Heart disease Father    Kidney disease Father    Anesthesia problems Neg Hx    Hypotension Neg Hx    Malignant hyperthermia Neg Hx    Pseudochol deficiency Neg Hx     Social History Social History   Tobacco Use   Smoking status: Every Day    Current packs/day: 1.00    Average packs/day: 1 pack/day for 15.0 years (15.0 ttl pk-yrs)    Types: Cigarettes   Smokeless tobacco: Never  Vaping Use   Vaping status: Never Used  Substance Use Topics   Alcohol use: No   Drug use: Yes    Frequency: 1.0 times per week    Types: Marijuana    Comment: occasional      Allergies   Patient has no known allergies.   Review of  Systems Review of Systems Per HPI  Physical Exam Triage Vital Signs ED Triage Vitals  Encounter Vitals Group     BP 10/30/23 1422 110/76     Girls Systolic BP Percentile --      Girls Diastolic BP Percentile --      Boys Systolic BP Percentile --      Boys Diastolic BP Percentile --      Pulse Rate 10/30/23 1422 98     Resp 10/30/23 1422 18     Temp 10/30/23 1422 99.1 F (37.3 C)     Temp Source 10/30/23 1422 Oral     SpO2 10/30/23 1422 93 %     Weight --      Height --      Head Circumference --      Peak Flow --      Pain Score 10/30/23 1423 5     Pain Loc --      Pain Education --      Exclude from Growth Chart --    No data found.  Updated Vital Signs BP 110/76 (BP Location: Right Arm)   Pulse 91   Temp 99.1 F (37.3 C) (Oral)   Resp 18   SpO2 98%   Visual Acuity Right Eye Distance:   Left Eye Distance:   Bilateral Distance:    Right Eye Near:   Left Eye Near:    Bilateral Near:     Physical Exam Vitals and nursing note reviewed.  Constitutional:      Appearance: Normal appearance.  HENT:     Head: Atraumatic.     Right Ear: Tympanic membrane and external ear normal.     Left Ear: Tympanic membrane and external ear normal.     Nose: Congestion present.     Mouth/Throat:     Mouth: Mucous membranes are moist.     Pharynx: Posterior oropharyngeal erythema present. No oropharyngeal exudate.  Eyes:     Extraocular Movements: Extraocular movements intact.     Conjunctiva/sclera: Conjunctivae normal.  Cardiovascular:     Rate and Rhythm: Normal rate and regular rhythm.     Heart sounds: Normal heart sounds.  Pulmonary:     Effort: Pulmonary effort is normal.     Breath sounds: Wheezing present. No rales.  Musculoskeletal:        General: Normal range of motion.     Cervical back: Normal range of motion and neck supple.  Lymphadenopathy:     Cervical: Cervical adenopathy present.  Skin:    General: Skin is warm and dry.  Neurological:      Mental Status: She is alert and oriented to person, place, and time.  Psychiatric:        Mood and Affect: Mood normal.        Thought Content: Thought content normal.      UC Treatments / Results  Labs (all labs ordered are listed, but only abnormal results are displayed) Labs Reviewed  POC SOFIA SARS ANTIGEN FIA - Normal  POCT RAPID STREP A (OFFICE) - Normal  CULTURE, GROUP A STREP [facility])    EKG   Radiology No results found.  Procedures Procedures (including critical care time)  Medications Ordered in UC Medications  ipratropium-albuterol  (DUONEB) 0.5-2.5 (3) MG/3ML nebulizer solution 3 mL (3 mLs Nebulization Given 10/30/23 1501)    Initial Impression / Assessment and Plan / UC Course  I have reviewed the triage vital signs and the nursing notes.  Pertinent labs & imaging results that were available during my care of the patient were reviewed by me and considered in my medical decision making (see chart for details).     Rapid strep and COVID-negative, throat culture pending.  Suspect viral respiratory infection causing a COPD exacerbation.  Some mild improvement with breathing after DuoNeb treatment in clinic.  Will treat with Phenergan  DM, viscous lidocaine , albuterol  inhaler and supportive over-the-counter medications at home care.  Work note given.  Return for worsening symptoms.  Final Clinical Impressions(s) / UC Diagnoses   Final diagnoses:  Sore throat  Viral URI with cough  COPD exacerbation (HCC)     Discharge Instructions      Your COVID and strep test were negative today, I have sent out a throat culture for further evaluation and we will let you know if this comes back positive.  I have sent over some medications to help with your symptoms in addition to over-the-counter medications as needed.    ED Prescriptions     Medication Sig Dispense Auth. Provider   promethazine -dextromethorphan (PROMETHAZINE -DM) 6.25-15 MG/5ML syrup Take 5 mLs by mouth  4 (four) times daily as needed. 100 mL Stuart Vernell Norris, PA-C   albuterol  (VENTOLIN  HFA) 108 (90 Base) MCG/ACT inhaler Inhale 2 puffs into the lungs every 4 (four) hours as needed. 18 g Stuart Vernell Norris, PA-C   lidocaine  (XYLOCAINE ) 2 % solution Use as directed 10 mLs in the mouth or throat every 3 (three) hours as needed. 100 mL Stuart Vernell Norris, [place]      PDMP not reviewed this encounter.   Stuart Vernell Norris, [place] 10/30/23 1555

## 2023-10-30 NOTE — Discharge Instructions (Signed)
 Your COVID and strep test were negative today, I have sent out a throat culture for further evaluation and we will let you know if this comes back positive.  I have sent over some medications to help with your symptoms in addition to over-the-counter medications as needed.

## 2023-11-02 LAB — CULTURE, GROUP A STREP (THRC)
